# Patient Record
Sex: Female | Born: 1970 | Race: Black or African American | Hispanic: No | Marital: Single | Smoking: Never smoker
Health system: Southern US, Community
[De-identification: ages and names within clinical notes are randomized; demographics above are authoritative.]

## PROBLEM LIST (undated history)

## (undated) DIAGNOSIS — R569 Unspecified convulsions: Secondary | ICD-10-CM

## (undated) DIAGNOSIS — K219 Gastro-esophageal reflux disease without esophagitis: Secondary | ICD-10-CM

## (undated) HISTORY — PX: ABDOMINAL HYSTERECTOMY: SHX81

---

## 1998-02-20 ENCOUNTER — Emergency Department (HOSPITAL_COMMUNITY): Admission: EM | Admit: 1998-02-20 | Discharge: 1998-02-20 | Payer: Self-pay | Admitting: Emergency Medicine

## 1998-03-05 ENCOUNTER — Other Ambulatory Visit: Admission: RE | Admit: 1998-03-05 | Discharge: 1998-03-05 | Payer: Self-pay | Admitting: Obstetrics and Gynecology

## 1998-10-13 ENCOUNTER — Emergency Department (HOSPITAL_COMMUNITY): Admission: EM | Admit: 1998-10-13 | Discharge: 1998-10-13 | Payer: Self-pay | Admitting: Emergency Medicine

## 1998-10-17 ENCOUNTER — Encounter: Admission: RE | Admit: 1998-10-17 | Discharge: 1998-10-17 | Payer: Self-pay | Admitting: Internal Medicine

## 1999-01-14 ENCOUNTER — Encounter: Admission: RE | Admit: 1999-01-14 | Discharge: 1999-01-14 | Payer: Self-pay | Admitting: Hematology and Oncology

## 1999-01-18 ENCOUNTER — Encounter: Payer: Self-pay | Admitting: Hematology and Oncology

## 1999-01-18 ENCOUNTER — Ambulatory Visit (HOSPITAL_COMMUNITY): Admission: RE | Admit: 1999-01-18 | Discharge: 1999-01-18 | Payer: Self-pay | Admitting: Hematology and Oncology

## 1999-01-28 ENCOUNTER — Encounter: Admission: RE | Admit: 1999-01-28 | Discharge: 1999-01-28 | Payer: Self-pay | Admitting: Hematology and Oncology

## 1999-07-18 ENCOUNTER — Emergency Department (HOSPITAL_COMMUNITY): Admission: EM | Admit: 1999-07-18 | Discharge: 1999-07-18 | Payer: Self-pay | Admitting: *Deleted

## 2000-03-21 ENCOUNTER — Emergency Department (HOSPITAL_COMMUNITY): Admission: EM | Admit: 2000-03-21 | Discharge: 2000-03-21 | Payer: Self-pay | Admitting: *Deleted

## 2000-03-31 ENCOUNTER — Encounter: Admission: RE | Admit: 2000-03-31 | Discharge: 2000-03-31 | Payer: Self-pay | Admitting: Hematology and Oncology

## 2000-07-26 ENCOUNTER — Other Ambulatory Visit: Admission: RE | Admit: 2000-07-26 | Discharge: 2000-07-26 | Payer: Self-pay | Admitting: Family Medicine

## 2002-09-17 ENCOUNTER — Ambulatory Visit (HOSPITAL_COMMUNITY): Admission: RE | Admit: 2002-09-17 | Discharge: 2002-09-17 | Payer: Self-pay | Admitting: Internal Medicine

## 2002-10-04 ENCOUNTER — Emergency Department (HOSPITAL_COMMUNITY): Admission: EM | Admit: 2002-10-04 | Discharge: 2002-10-05 | Payer: Self-pay | Admitting: Emergency Medicine

## 2003-03-23 ENCOUNTER — Emergency Department (HOSPITAL_COMMUNITY): Admission: EM | Admit: 2003-03-23 | Discharge: 2003-03-23 | Payer: Self-pay

## 2003-04-05 ENCOUNTER — Ambulatory Visit: Admission: RE | Admit: 2003-04-05 | Discharge: 2003-04-05 | Payer: Self-pay

## 2003-08-17 ENCOUNTER — Emergency Department (HOSPITAL_COMMUNITY): Admission: EM | Admit: 2003-08-17 | Discharge: 2003-08-17 | Payer: Self-pay | Admitting: *Deleted

## 2003-09-23 ENCOUNTER — Emergency Department (HOSPITAL_COMMUNITY): Admission: EM | Admit: 2003-09-23 | Discharge: 2003-09-23 | Payer: Self-pay | Admitting: *Deleted

## 2003-09-25 ENCOUNTER — Ambulatory Visit: Payer: Self-pay | Admitting: *Deleted

## 2003-09-25 ENCOUNTER — Emergency Department (HOSPITAL_COMMUNITY): Admission: EM | Admit: 2003-09-25 | Discharge: 2003-09-25 | Payer: Self-pay | Admitting: Family Medicine

## 2004-01-31 ENCOUNTER — Ambulatory Visit: Payer: Self-pay | Admitting: Family Medicine

## 2004-02-18 ENCOUNTER — Ambulatory Visit: Payer: Self-pay | Admitting: Internal Medicine

## 2004-04-08 ENCOUNTER — Ambulatory Visit: Payer: Self-pay | Admitting: Family Medicine

## 2004-04-18 ENCOUNTER — Emergency Department (HOSPITAL_COMMUNITY): Admission: EM | Admit: 2004-04-18 | Discharge: 2004-04-18 | Payer: Self-pay | Admitting: Emergency Medicine

## 2004-04-22 ENCOUNTER — Ambulatory Visit: Payer: Self-pay | Admitting: Family Medicine

## 2004-07-01 ENCOUNTER — Ambulatory Visit: Payer: Self-pay | Admitting: Internal Medicine

## 2004-07-07 ENCOUNTER — Emergency Department (HOSPITAL_COMMUNITY): Admission: EM | Admit: 2004-07-07 | Discharge: 2004-07-07 | Payer: Self-pay | Admitting: Emergency Medicine

## 2004-08-15 ENCOUNTER — Emergency Department (HOSPITAL_COMMUNITY): Admission: EM | Admit: 2004-08-15 | Discharge: 2004-08-15 | Payer: Self-pay | Admitting: Emergency Medicine

## 2004-09-24 ENCOUNTER — Ambulatory Visit: Payer: Self-pay | Admitting: Family Medicine

## 2004-10-16 ENCOUNTER — Ambulatory Visit: Payer: Self-pay | Admitting: Family Medicine

## 2004-10-23 ENCOUNTER — Encounter (INDEPENDENT_AMBULATORY_CARE_PROVIDER_SITE_OTHER): Payer: Self-pay | Admitting: Family Medicine

## 2004-10-23 LAB — CONVERTED CEMR LAB: Pap Smear: NORMAL

## 2004-10-28 ENCOUNTER — Ambulatory Visit: Payer: Self-pay | Admitting: Family Medicine

## 2004-12-21 ENCOUNTER — Ambulatory Visit: Payer: Self-pay | Admitting: Family Medicine

## 2004-12-23 ENCOUNTER — Ambulatory Visit: Payer: Self-pay | Admitting: Family Medicine

## 2005-01-20 ENCOUNTER — Ambulatory Visit (HOSPITAL_COMMUNITY): Admission: RE | Admit: 2005-01-20 | Discharge: 2005-01-20 | Payer: Self-pay | Admitting: Family Medicine

## 2005-04-08 ENCOUNTER — Ambulatory Visit: Payer: Self-pay | Admitting: Family Medicine

## 2005-07-21 ENCOUNTER — Ambulatory Visit: Payer: Self-pay | Admitting: Family Medicine

## 2005-09-14 ENCOUNTER — Ambulatory Visit: Payer: Self-pay | Admitting: Family Medicine

## 2005-09-15 ENCOUNTER — Ambulatory Visit: Payer: Self-pay | Admitting: Family Medicine

## 2005-09-17 ENCOUNTER — Inpatient Hospital Stay (HOSPITAL_COMMUNITY): Admission: AD | Admit: 2005-09-17 | Discharge: 2005-09-17 | Payer: Self-pay | Admitting: Family Medicine

## 2005-10-09 ENCOUNTER — Emergency Department (HOSPITAL_COMMUNITY): Admission: EM | Admit: 2005-10-09 | Discharge: 2005-10-09 | Payer: Self-pay | Admitting: Family Medicine

## 2005-11-01 ENCOUNTER — Ambulatory Visit (HOSPITAL_COMMUNITY): Admission: RE | Admit: 2005-11-01 | Discharge: 2005-11-01 | Payer: Self-pay | Admitting: Obstetrics and Gynecology

## 2005-11-15 ENCOUNTER — Ambulatory Visit (HOSPITAL_COMMUNITY): Admission: RE | Admit: 2005-11-15 | Discharge: 2005-11-15 | Payer: Self-pay | Admitting: Obstetrics and Gynecology

## 2005-11-29 ENCOUNTER — Ambulatory Visit (HOSPITAL_COMMUNITY): Admission: RE | Admit: 2005-11-29 | Discharge: 2005-11-29 | Payer: Self-pay | Admitting: Obstetrics and Gynecology

## 2005-11-29 ENCOUNTER — Inpatient Hospital Stay (HOSPITAL_COMMUNITY): Admission: AD | Admit: 2005-11-29 | Discharge: 2005-11-29 | Payer: Self-pay | Admitting: Obstetrics & Gynecology

## 2005-12-16 ENCOUNTER — Ambulatory Visit (HOSPITAL_COMMUNITY): Admission: RE | Admit: 2005-12-16 | Discharge: 2005-12-16 | Payer: Self-pay | Admitting: Obstetrics and Gynecology

## 2005-12-30 ENCOUNTER — Inpatient Hospital Stay (HOSPITAL_COMMUNITY): Admission: AD | Admit: 2005-12-30 | Discharge: 2005-12-30 | Payer: Self-pay | Admitting: *Deleted

## 2006-01-20 ENCOUNTER — Ambulatory Visit (HOSPITAL_COMMUNITY): Admission: RE | Admit: 2006-01-20 | Discharge: 2006-01-20 | Payer: Self-pay | Admitting: Obstetrics and Gynecology

## 2006-02-24 ENCOUNTER — Inpatient Hospital Stay (HOSPITAL_COMMUNITY): Admission: AD | Admit: 2006-02-24 | Discharge: 2006-02-24 | Payer: Self-pay | Admitting: Obstetrics and Gynecology

## 2006-04-29 ENCOUNTER — Inpatient Hospital Stay (HOSPITAL_COMMUNITY): Admission: AD | Admit: 2006-04-29 | Discharge: 2006-05-02 | Payer: Self-pay | Admitting: Obstetrics and Gynecology

## 2006-07-05 ENCOUNTER — Encounter (INDEPENDENT_AMBULATORY_CARE_PROVIDER_SITE_OTHER): Payer: Self-pay | Admitting: Family Medicine

## 2006-07-05 DIAGNOSIS — R569 Unspecified convulsions: Secondary | ICD-10-CM | POA: Insufficient documentation

## 2006-07-05 DIAGNOSIS — F411 Generalized anxiety disorder: Secondary | ICD-10-CM | POA: Insufficient documentation

## 2006-07-05 DIAGNOSIS — J309 Allergic rhinitis, unspecified: Secondary | ICD-10-CM | POA: Insufficient documentation

## 2007-04-04 IMAGING — US US OB FOLLOW-UP
1 series · 14 of 28 positions shown · non-contrast
Comparison: none

OBSTETRICAL ULTRASOUND:
 This ultrasound was performed in The [HOSPITAL], and the [facility] report will be stored to [REDACTED] PACS.

[Series 1: us ob follow-up · 14 of 58 slices shown]
[im 3/58]
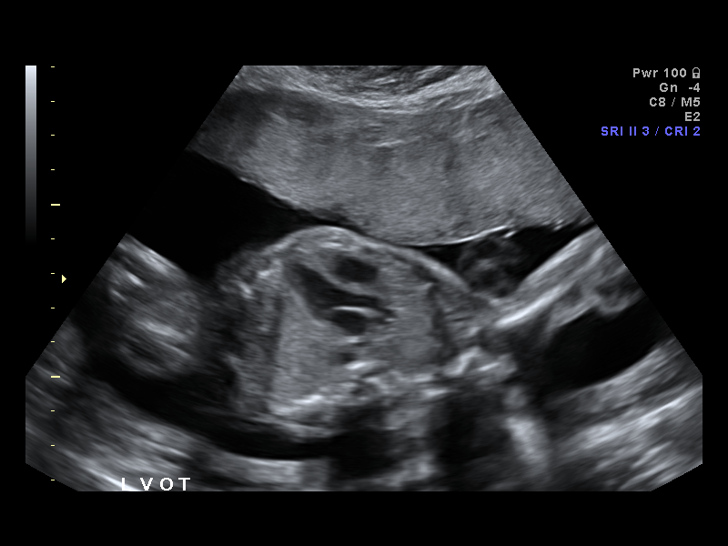
[im 7/58]
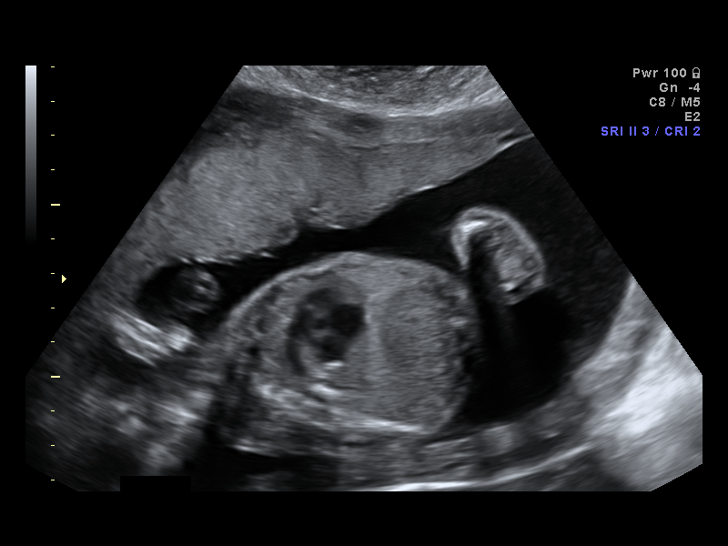
[im 11/58]
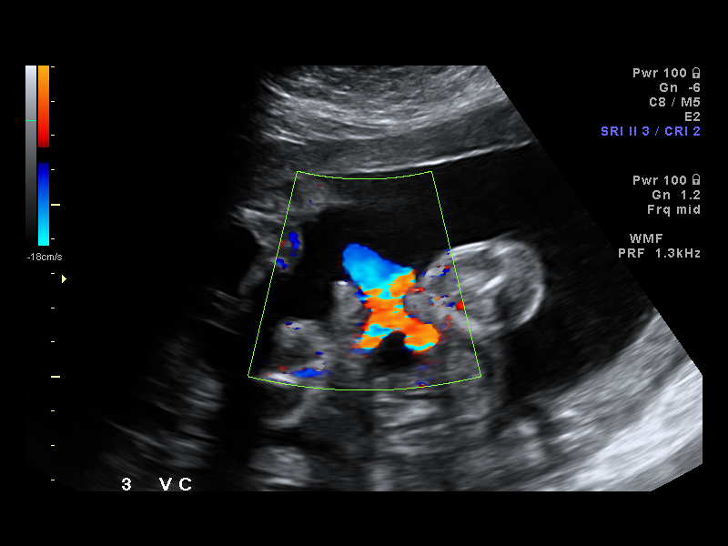
[im 15/58]
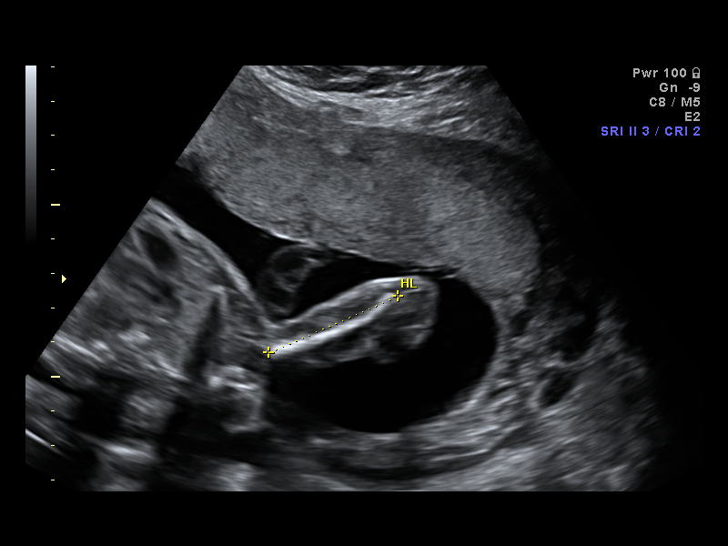
[im 20/58]
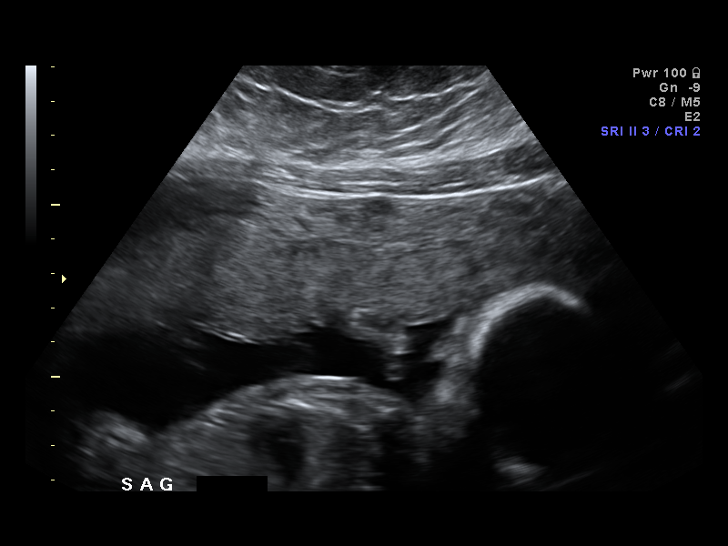
[im 24/58]
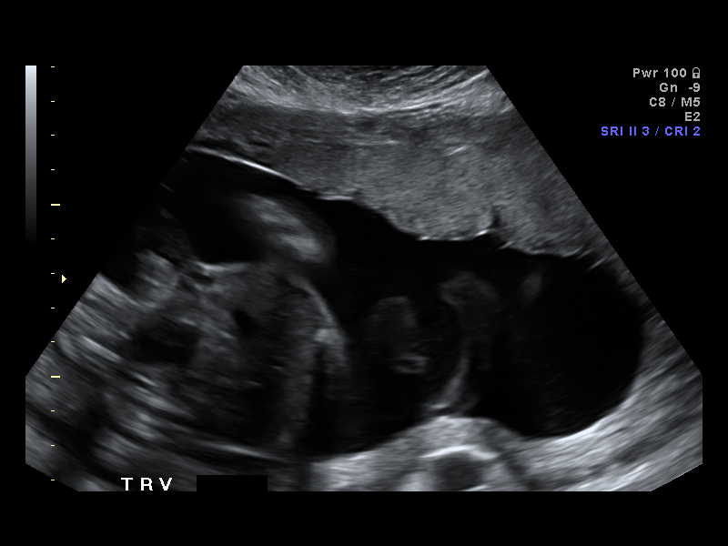
[im 28/58]
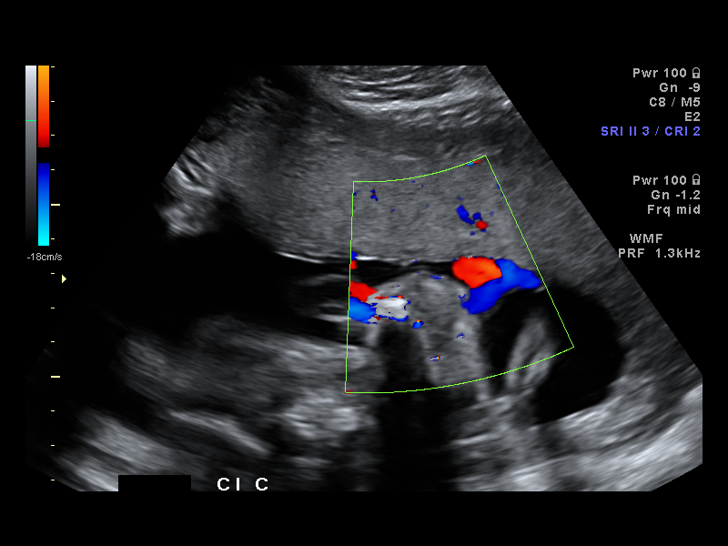
[im 32/58]
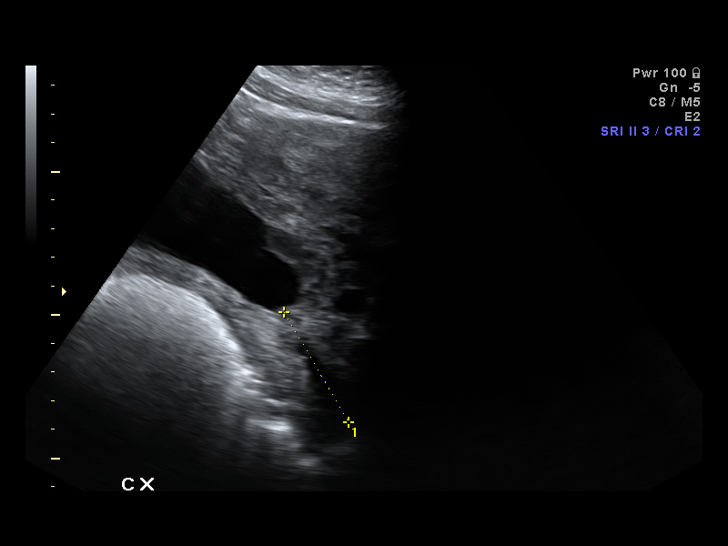
[im 36/58]
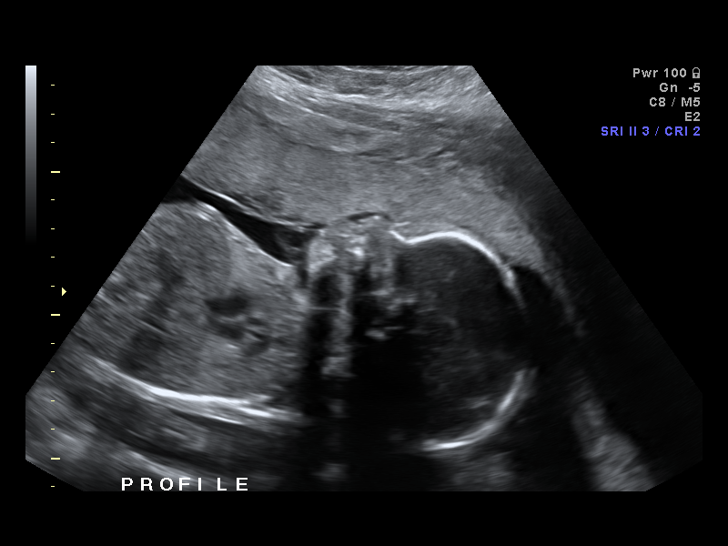
[im 41/58]
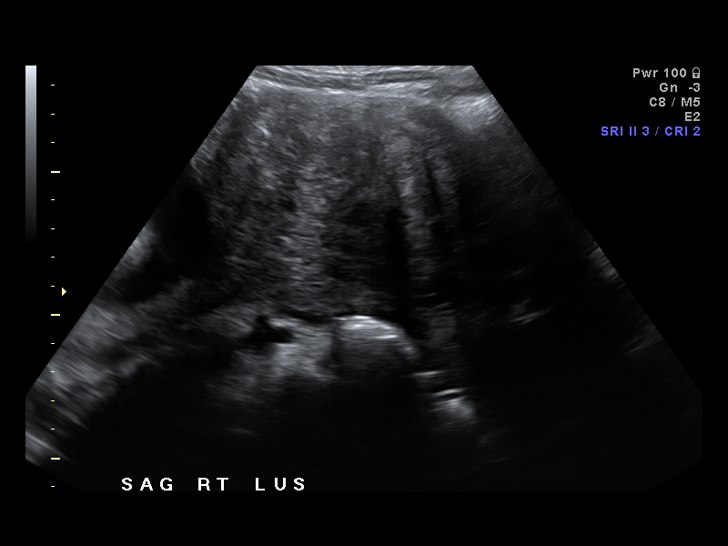
[im 45/58]
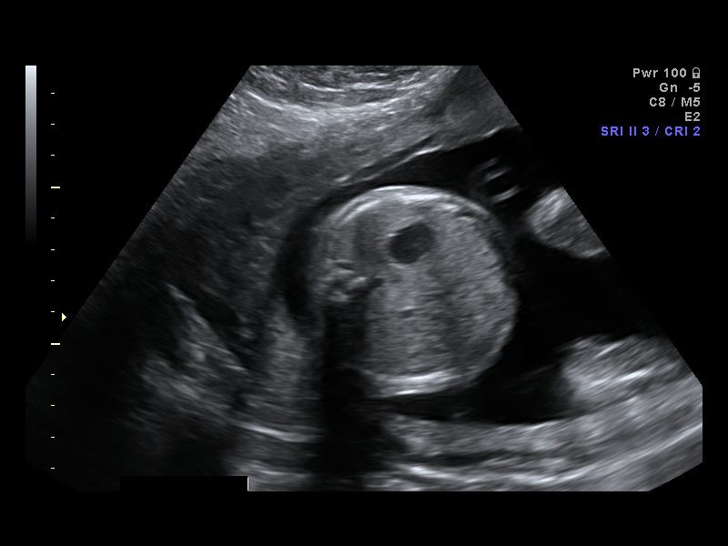
[im 49/58]
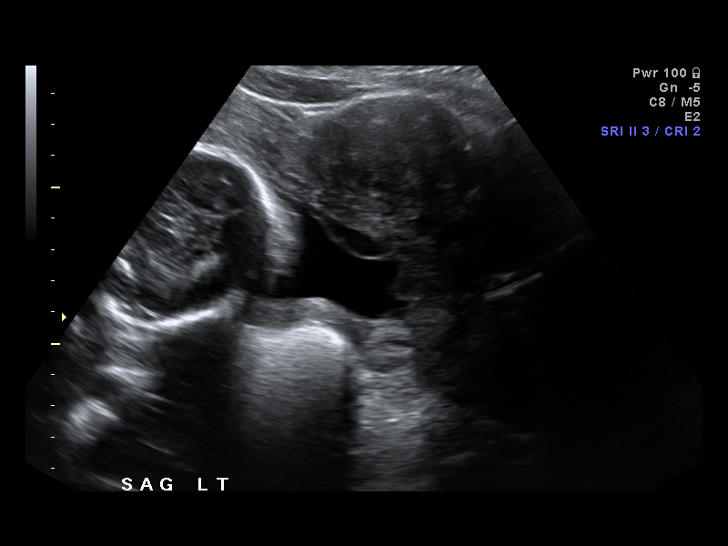
[im 53/58]
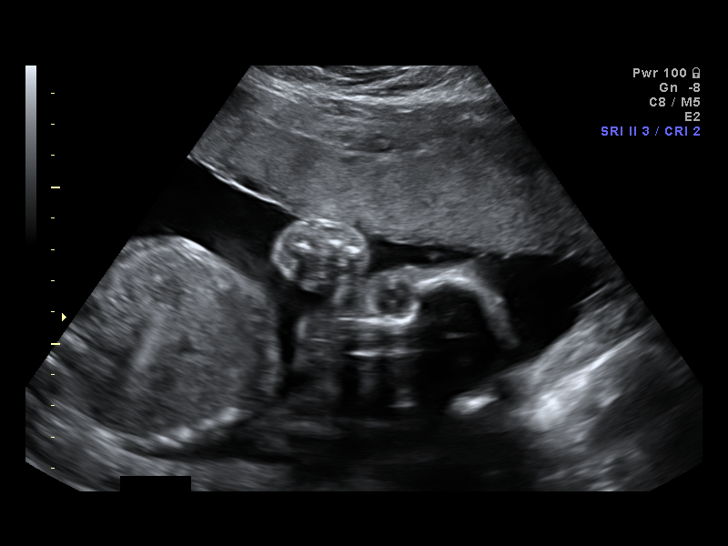
[im 58/58]
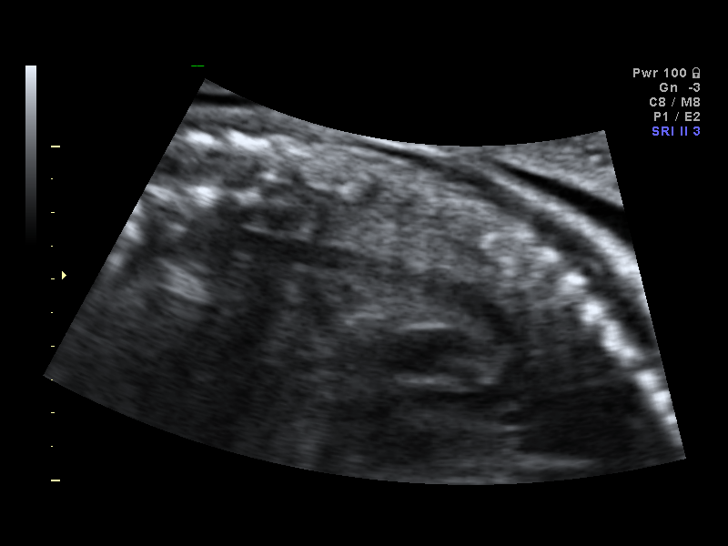

[14 of 28 positions shown; findings below may reference images not displayed]

IMPRESSION: The [facility] report has also been faxed to the ordering physician.

## 2007-06-05 ENCOUNTER — Emergency Department (HOSPITAL_COMMUNITY): Admission: EM | Admit: 2007-06-05 | Discharge: 2007-06-05 | Payer: Self-pay | Admitting: Family Medicine

## 2007-10-23 ENCOUNTER — Other Ambulatory Visit: Admission: RE | Admit: 2007-10-23 | Discharge: 2007-10-23 | Payer: Self-pay | Admitting: Family Medicine

## 2007-10-23 ENCOUNTER — Encounter (INDEPENDENT_AMBULATORY_CARE_PROVIDER_SITE_OTHER): Payer: Self-pay | Admitting: Adult Health

## 2007-10-23 ENCOUNTER — Ambulatory Visit: Payer: Self-pay | Admitting: Internal Medicine

## 2007-10-23 LAB — CONVERTED CEMR LAB
ALT: 17 units/L (ref 0–35)
Basophils Absolute: 0 10*3/uL (ref 0.0–0.1)
CO2: 21 meq/L (ref 19–32)
Chloride: 106 meq/L (ref 96–112)
Cholesterol: 176 mg/dL (ref 0–200)
Eosinophils Absolute: 0 10*3/uL (ref 0.0–0.7)
HCT: 39.9 % (ref 36.0–46.0)
Hemoglobin: 13.4 g/dL (ref 12.0–15.0)
MCHC: 33.6 g/dL (ref 30.0–36.0)
MCV: 83.6 fL (ref 78.0–100.0)
Monocytes Absolute: 0.3 10*3/uL (ref 0.1–1.0)
Platelets: 250 10*3/uL (ref 150–400)
RBC: 4.77 M/uL (ref 3.87–5.11)
RDW: 14.4 % (ref 11.5–15.5)
Sodium: 142 meq/L (ref 135–145)
Total Bilirubin: 0.4 mg/dL (ref 0.3–1.2)
Total Protein: 7.2 g/dL (ref 6.0–8.3)
VLDL: 13 mg/dL (ref 0–40)
WBC: 5.4 10*3/uL (ref 4.0–10.5)

## 2007-10-24 ENCOUNTER — Encounter (INDEPENDENT_AMBULATORY_CARE_PROVIDER_SITE_OTHER): Payer: Self-pay | Admitting: Adult Health

## 2007-11-01 ENCOUNTER — Ambulatory Visit (HOSPITAL_COMMUNITY): Admission: RE | Admit: 2007-11-01 | Discharge: 2007-11-01 | Payer: Self-pay | Admitting: Family Medicine

## 2008-02-29 ENCOUNTER — Ambulatory Visit: Payer: Self-pay | Admitting: Internal Medicine

## 2008-02-29 ENCOUNTER — Encounter (INDEPENDENT_AMBULATORY_CARE_PROVIDER_SITE_OTHER): Payer: Self-pay | Admitting: Adult Health

## 2008-03-07 ENCOUNTER — Ambulatory Visit (HOSPITAL_COMMUNITY): Admission: RE | Admit: 2008-03-07 | Discharge: 2008-03-07 | Payer: Self-pay | Admitting: Internal Medicine

## 2008-05-02 ENCOUNTER — Ambulatory Visit: Payer: Self-pay | Admitting: Obstetrics & Gynecology

## 2008-05-02 LAB — CONVERTED CEMR LAB
HCT: 38.8 % (ref 36.0–46.0)
Hemoglobin: 13.3 g/dL (ref 12.0–15.0)
MCHC: 34.3 g/dL (ref 30.0–36.0)
MCV: 83.3 fL (ref 78.0–100.0)
Platelets: 237 10*3/uL (ref 150–400)
RBC: 4.66 M/uL (ref 3.87–5.11)
RDW: 14.5 % (ref 11.5–15.5)
TSH: 0.827 microintl units/mL (ref 0.350–4.500)
WBC: 5.9 10*3/uL (ref 4.0–10.5)

## 2008-07-01 ENCOUNTER — Ambulatory Visit: Payer: Self-pay | Admitting: Obstetrics & Gynecology

## 2008-07-01 ENCOUNTER — Encounter: Payer: Self-pay | Admitting: Obstetrics & Gynecology

## 2008-07-01 ENCOUNTER — Ambulatory Visit (HOSPITAL_COMMUNITY): Admission: RE | Admit: 2008-07-01 | Discharge: 2008-07-02 | Payer: Self-pay | Admitting: Obstetrics & Gynecology

## 2008-08-09 ENCOUNTER — Ambulatory Visit: Payer: Self-pay | Admitting: Obstetrics & Gynecology

## 2009-05-28 ENCOUNTER — Emergency Department (HOSPITAL_COMMUNITY): Admission: EM | Admit: 2009-05-28 | Discharge: 2009-05-28 | Payer: Self-pay | Admitting: Family Medicine

## 2009-09-29 ENCOUNTER — Emergency Department (HOSPITAL_COMMUNITY): Admission: EM | Admit: 2009-09-29 | Discharge: 2009-09-29 | Payer: Self-pay | Admitting: Emergency Medicine

## 2010-01-25 ENCOUNTER — Encounter: Payer: Self-pay | Admitting: Obstetrics and Gynecology

## 2010-04-13 LAB — CBC
HCT: 35.7 % — ABNORMAL LOW (ref 36.0–46.0)
HCT: 38.4 % (ref 36.0–46.0)
Hemoglobin: 12.3 g/dL (ref 12.0–15.0)
Hemoglobin: 13.2 g/dL (ref 12.0–15.0)
MCHC: 34.3 g/dL (ref 30.0–36.0)
MCHC: 34.4 g/dL (ref 30.0–36.0)
MCV: 87.8 fL (ref 78.0–100.0)
MCV: 88.6 fL (ref 78.0–100.0)
RBC: 4.03 MIL/uL (ref 3.87–5.11)
RDW: 13.4 % (ref 11.5–15.5)
WBC: 6.1 10*3/uL (ref 4.0–10.5)
WBC: 7.9 10*3/uL (ref 4.0–10.5)

## 2010-05-19 NOTE — Discharge Summary (Signed)
Carol Prince, Carol Prince              ACCOUNT NO.:  [id number]   MEDICAL RECORD NO.:  [id number]          PATIENT TYPE:  OIB   LOCATION:  9304                          FACILITY:  [facility]   PHYSICIAN:  Allie Bossier, MD        DATE OF BIRTH:  1971/01/03   DATE OF ADMISSION:  07/01/2008  DATE OF DISCHARGE:  07/02/2008                               DISCHARGE SUMMARY   ADMISSION DIAGNOSES:  1. Fibroids.  2. Dysmenorrhea.   DISCHARGE DIAGNOSES:  1. Fibroids.  2. Dysmenorrhea.   PROCEDURE:  Total vaginal hysterectomy.   CONDITION:  Stable.   DISPOSITION:  Home.   DIET:  As tolerated.   FOLLOWUP:  Followup in 6 weeks or sooner as necessary.   DISCHARGE MEDICATIONS:  Percocet 1 p.o. q.4 h. p.r.n. pain #30, no  refills, but she is also instructed to take over-the-counter ibuprofen  600 mg q.6 h. as necessary.   BRIEF HISTORY OF PRESENT ILLNESS AND HOSPITAL COURSE:  Carol Prince is a  40 year old G0, who has been having long history of pelvic pain and  dysmenorrhea.  Ultrasound showed a 4-cm fibroid.  After discussion of  treatment options, she decided to have a hysterectomy.  She underwent an  uncomplicated vaginal hysterectomy under general anesthesia without  complications.  On the July 01, 2008, she had approximately 150 mL EBL.  Her preop hemoglobin was 13.2 and postop hemoglobin was 12.3.  By postop  day #1 she was ambulating, voiding without dysuria, and tolerating p.o.  She was also experiencing flatus and was ready to go home.  She will  follow up as above.      Allie Bossier, MD  Electronically Signed     MCD/MEDQ  D:  07/02/2008  T:  07/02/2008  Job:  [id number]

## 2010-05-19 NOTE — Op Note (Signed)
Carol Prince, Carol Prince              ACCOUNT NO.:  [id number]   MEDICAL RECORD NO.:  [id number]          PATIENT TYPE:  OIB   LOCATION:  9304                          [facility]   PHYSICIAN:  Allie Bossier, MD        DATE OF BIRTH:  May 13, 1970   DATE OF PROCEDURE:  07/01/2008  DATE OF DISCHARGE:                               OPERATIVE REPORT   PREOPERATIVE DIAGNOSES:  Fibroid uterus and dysmenorrhea.   POSTOPERATIVE DIAGNOSES:  Fibroid uterus and dysmenorrhea.   PROCEDURE:  Total vaginal hysterectomy.   SURGEON:  Allie Bossier, MD   ANESTHESIA:  GETA, Angelica Pou, MD   COMPLICATIONS:  None.   ESTIMATED BLOOD LOSS:  150 mL.   SPECIMEN:  Uterus.   FINDINGS:  Normal-appearing adnexa.  Enlarged fibroid uterus.   DETAIL OF PROCEDURE AND FINDINGS:  Risks, benefits, alternatives of  surgery were explained understood, and accepted.  Consents were signed.  All questions were answered prior to surgery.  In the operating room  general anesthesia was applied without complication.  She was placed in  dorsal lithotomy position.  Her vagina was prepped and draped usual  sterile fashion.  A Foley catheter was placed under sterile conditions  and it drained clear urine throughout the case.  Bimanual exam confirmed  that she has a 6-8 week size, very mobile uterus and nonenlarged adnexa.  She also has an excellent pelvis and suprapubic arch for a vaginal  hysterectomy.  A weighted speculum was placed posteriorly and Deaver  anteriorly.  The cervix was grasped across the anterior and posterior  lips the cervix with a single-tooth tenaculum.  I created a solution of  13 mL of 0.5% Marcaine, mixed with a 100 mL of injectable saline, 90 mL  of the solution was injected in a circumferential fashion in the  cervicovaginal junction for hydrodissection purposes.  A circumferential  incision was made a #10 blade at the same area.  The anterior and  posterior peritoneum were both entered, tagged  and held.  The  uterosacral ligaments were identified, clamped, cut and ligated.  A 2-0  Vicryl suture was used throughout this case unless otherwise specified.  The cervix was separated from its pelvic attachments using clamp, cut,  and ligate technique, always keeping the  Heaney clamps close to the  wall of the uterus.  When the uterus was removed, a sponge on a stick  was placed to hold the bowel out of the vagina and the pedicles were all  inspected.  All was hemostatic.  Please note that the uterosacral  ligaments had been tagged and held and the tagged uterosacral ligaments  were incorporated into the each angle of the vaginal cuff.  The  posterior peritoneum was closed in a pursestring fashion prior to this  and then the vaginal cuff was closed in a vertical fashion.  No defects  were palpable.  There was no bleeding.  A Foley catheter was draining  clear urine.  She was extubated and taken to recovery in stable  condition.      [name]  Marice Potter, MD  Electronically Signed     MCD/MEDQ  D:  07/01/2008  T:  07/02/2008  Job:  [id number]

## 2010-05-19 NOTE — Group Therapy Note (Signed)
NAME:  Carol Prince, VEGH NO.:  [id number]   MEDICAL RECORD NO.:  [id number]          PATIENT TYPE:  WOC   LOCATION:  [facility]                   FACILITY:  [facility]   PHYSICIAN:  Allie Bossier, MD        DATE OF BIRTH:  06-06-1970   DATE OF SERVICE:                                  CLINIC NOTE   HISTORY:  Donyetta is a 40 year old single black, gravida 4, para 2,  abortion 2 with 2 children.  She comes as a referral from the  appointment from the [facility].  She has a known history of fibroids  and she told the Cristhian Vanhook at the health department that she has  significant pain with her periods and would like to discuss a surgical  option for treatment.  When I discussed her pelvic pain with her, she  says that she does take ibuprofen 800 as necessary.  She says the  periods are not particularly heavy but can last up to 8 days.  She has  not had a thyroid or CBC checked that she notice of.  An ultrasound was  done last month which shows a 3.7 x 5.1 x 2.5 cm fibroids in the right  upper lateral uterine segment.  The endometrial lining was thin, with  width of 3.7 mm, the ovaries appeared normal.   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  She says that she has not  had a seizure anytime recently while she is being treated with  carbamazepine 200 mg twice a day.  I discussed with her the options of  either managing the pain/dysmenorrhea with medicines versus a  hysterectomy upon elucidation of the risks associated with surgery.  She  would like to consider her options at home and call me when she has  decided what she wants to do.  I am certainly in agreement with this  plan.  Bimanual exam does reveal that she has a mobile uterus at 6-8  weeks' size.  She has excellent pelvis.  She has an excellent suprapubic  arch for removal of a uterus vaginally.  I think she will be a good  candidate for  vaginal hysterectomy, should she choose surgical options.      Allie Bossier, MD     MCD/MEDQ  D:  05/02/2008  T:  05/03/2008  Job:  [id number]

## 2010-05-22 NOTE — H&P (Signed)
NAME:  Carol Prince, Carol Prince NO.:  [id number]   MEDICAL RECORD NO.:  [id number]          PATIENT TYPE:  INP   LOCATION:  9175                          [facility]ILITY[facility]   PHYSICIAN:  Osborn Coho, M.D.   DATE OF BIRTH:  12/21/1970   DATE OF ADMISSION:  04/29/2006  DATE OF DISCHARGE:                              HISTORY & PHYSICAL   HISTORY OF PRESENT ILLNESS:  This is a 40 year old gravida 4, para 1-0-2-  1 at 38-2/7 weeks who presents with complaints of gush of fluid at 5  p.m.; she does not feel any contractions and there is positive fetal  movement.  The pregnancy has been followed by the M.D. service and is  remarkable for:  1. AMA.  2. Increased BMI.  3. History of seizure disorder for which she takes Tegretol.  4. First trimester spotting.  5. Fibroids.  6. History of postpartum depression.  7. Group B strep negative.  8. Desires bilateral tubal ligation.   ALLERGIES:  AMPICILLIN.   OB HISTORY:  Remarkable for elective abortion in 1993 and 1996, and a  vaginal delivery in 1994 of a female infant at [redacted] weeks gestation weighing  7 pounds 13 ounces with no complications.   PAST MEDICAL HISTORY:  1. History of postpartum depression.  2. History of chlamydia 13 years ago.  3. History of seizures since childhood, due to stress, for which she      uses Tegretol.   PAST SURGICAL HISTORY:  1. Right foot surgery 20 years ago.  2. Cholecystectomy 10 years ago.   FAMILY HISTORY:  Remarkable for grandmother with heart disease,  hypertension, diabetes and renal failure.   GENETIC HISTORY:  Remarkable for 3 cousins with twins.   SOCIAL HISTORY:  The patient is single.  Father of the baby is [facility], involved and supportive.  She does not report a religious  affiliation.  She denies any alcohol, tobacco or drug use.   PRENATAL LABS:  Hemoglobin is 13.5 and platelets are 251.  Blood type is  A positive.  Antibody screen negative.  RPR nonreactive.   Rubella  immune.  Hepatitis negative.  HIV negative.  Gonorrhea negative.  Chlamydia negative.  Hemoglobin electrophoresis negative.  Cystic  fibrosis negative.   HISTORY OF CURRENT PREGNANCY:  Patient entered care at [redacted] weeks  gestation.  She was treated for nausea with Phenergan.  She was  counseled on prenatal genetic testing.  She had an ultrasound at 9 weeks  for bleeding and was found to have a complete previa with subchorionic  hemorrhage and also 2 fibroids on her uterus.  She had first trimester  screening done.  She was referred back to neurology but did not keep her  first several appointments.  She saw Dr. Margot Ables for consultation at 15  weeks; she declined amnio.  She had a normal first trimester screen  result.  Tegretol levels were checked throughout pregnancy and were  normal.  She had a Glucola at 26 weeks that was normal.   She had an ultrasound at 28 weeks showing 89 percentile growth and  otherwise normal.  She had another ultrasound at 32 weeks showing 66  percentile growth, fibroids were stable at 4 and 8 cm.   Tegretol was increased to three times a day with therapeutic Tegretol  levels.  Group B strep was negative at term.   OBJECTIVE:  VITAL SIGNS:  Stable, afebrile.  HEENT:  Within normal limits.  NECK:  Thyroid normal, not enlarged.  CHEST:  Clear to auscultation.  HEART:  Regular rate and rhythm.  ABDOMEN:  Gravid.  PELVIC:  Vertex to Leopold's.  EFM shows reactive fetal heart rate with  contractions every 6-10 minutes which are mild.  Cervix is 3 cm, 70%  effaced and -3 station with a vertex presentation.  There is clear fluid  draining copiously from the vagina.  EXTREMITIES:  Within normal limits except for trace edema.   ASSESSMENT:  1. Intrauterine pregnancy at 38-2/7 weeks.  2. Premature rupture of membranes at term.  3. Prodromal contractions.   PLAN:  1. Admit per Dr. Su Hilt.  2. Routine M.D. orders.  3. Pitocin per low-dose protocol.   4. Epidural or Stadol p.r.n.      Marie L. Williams, C.N.M.      Osborn Coho, M.D.  Electronically Signed    MLW/MEDQ  D:  04/29/2006  T:  04/29/2006  Job:  [id number]

## 2010-05-28 ENCOUNTER — Ambulatory Visit (INDEPENDENT_AMBULATORY_CARE_PROVIDER_SITE_OTHER): Payer: Self-pay

## 2010-05-28 ENCOUNTER — Inpatient Hospital Stay (INDEPENDENT_AMBULATORY_CARE_PROVIDER_SITE_OTHER)
Admission: RE | Admit: 2010-05-28 | Discharge: 2010-05-28 | Disposition: A | Discharge status: Home / Self Care | Payer: Self-pay | Source: Ambulatory Visit | Attending: Emergency Medicine | Admitting: Emergency Medicine

## 2010-05-28 DIAGNOSIS — S139XXA Sprain of joints and ligaments of unspecified parts of neck, initial encounter: Secondary | ICD-10-CM

## 2010-11-14 ENCOUNTER — Emergency Department (INDEPENDENT_AMBULATORY_CARE_PROVIDER_SITE_OTHER)
Admission: EM | Admit: 2010-11-14 | Discharge: 2010-11-14 | Disposition: A | Discharge status: Home / Self Care | Payer: Self-pay | Source: Home / Self Care | Attending: Emergency Medicine | Admitting: Emergency Medicine

## 2010-11-14 DIAGNOSIS — J029 Acute pharyngitis, unspecified: Secondary | ICD-10-CM

## 2010-11-14 DIAGNOSIS — J069 Acute upper respiratory infection, unspecified: Secondary | ICD-10-CM

## 2010-11-14 HISTORY — DX: Unspecified convulsions: R56.9

## 2010-11-14 MED ORDER — GUAIFENESIN-CODEINE 100-10 MG/5ML PO SYRP: 5.0000 mL | ORAL_SOLUTION | Freq: Three times a day (TID) | ORAL | Status: AC | PRN

## 2010-11-14 MED ORDER — LORATADINE 10 MG PO TABS: 10.0000 mg | ORAL_TABLET | Freq: Every day | ORAL | Status: DC

## 2010-11-14 NOTE — ED Provider Notes (Addendum)
History     CSN: [id number] Arrival date & time: 11/14/2010  5:17 PM   First MD Initiated Contact with Patient 11/14/10 1752      Chief Complaint  Patient presents with  . Sore Throat    Pt has sorethroat, chills, body aches and chest congestion that started yesterday    (HPI Comments: My chest burns and my throat" now Im starting to feel my nose its runny,also with a headache..  Patient is a 40 y.o. female presenting with pharyngitis. The history is provided by the patient. A language interpreter was used.  Sore Throat This is a new problem. The current episode started yesterday. The problem occurs constantly. The problem has not changed since onset.Pertinent negatives include no chest pain, no abdominal pain, no headaches and no shortness of breath. The symptoms are aggravated by swallowing. The symptoms are relieved by nothing.    Past Medical History  Diagnosis Date  . Seizures     Past Surgical History  Procedure Date  . Abdominal hysterectomy     History reviewed. No pertinent family history.  History  Substance Use Topics  . Smoking status: Never Smoker   . Smokeless tobacco: Not on file  . Alcohol Use: No    OB History    Grav Para Term Preterm Abortions TAB SAB Ect Mult Living                  Review of Systems  Respiratory: Negative for shortness of breath.   Cardiovascular: Negative for chest pain.  Gastrointestinal: Negative for abdominal pain.  Neurological: Negative for headaches.    Allergies  Ampicillin  Home Medications   Current Outpatient Rx  Name Route Sig Dispense Refill  . CARBAMAZEPINE 200 MG PO TABS Oral Take 200 mg by mouth 2 (two) times daily between meals.        BP 112/78  Pulse 89  Temp(Src) 99.4 F (37.4 C) (Oral)  Resp 14  SpO2 99%  Physical Exam  ED Course  Procedures (including critical care time)   Labs Reviewed  POCT RAPID STREP A ([facility] ONLY)   No results found.   No diagnosis found.    MDM   EARLY URI, works at daycare        Jimmie Molly, MD 11/14/10 1803  Jimmie Molly, MD 11/14/10 [phone]

## 2011-02-13 ENCOUNTER — Emergency Department (HOSPITAL_COMMUNITY)
Admission: EM | Admit: 2011-02-13 | Discharge: 2011-02-13 | Disposition: A | Discharge status: Home / Self Care | Payer: Self-pay | Attending: Emergency Medicine | Admitting: Emergency Medicine

## 2011-02-13 ENCOUNTER — Encounter (HOSPITAL_COMMUNITY): Payer: Self-pay

## 2011-02-13 ENCOUNTER — Other Ambulatory Visit: Payer: Self-pay

## 2011-02-13 DIAGNOSIS — Z79899 Other long term (current) drug therapy: Secondary | ICD-10-CM | POA: Insufficient documentation

## 2011-02-13 DIAGNOSIS — G40909 Epilepsy, unspecified, not intractable, without status epilepticus: Secondary | ICD-10-CM | POA: Insufficient documentation

## 2011-02-13 LAB — DIFFERENTIAL
Basophils Absolute: 0 10*3/uL (ref 0.0–0.1)
Basophils Relative: 0 % (ref 0–1)
Lymphocytes Relative: 30 % (ref 12–46)
Monocytes Relative: 8 % (ref 3–12)
Neutro Abs: 3.2 10*3/uL (ref 1.7–7.7)
Neutrophils Relative %: 61 % (ref 43–77)

## 2011-02-13 LAB — BASIC METABOLIC PANEL
Chloride: 107 mEq/L (ref 96–112)
GFR calc Af Amer: >90 mL/min (ref >90)
Potassium: 3.8 mEq/L (ref 3.5–5.1)

## 2011-02-13 LAB — CBC
Hemoglobin: 12.4 g/dL (ref 12.0–15.0)
MCHC: 35 g/dL (ref 30.0–36.0)
RDW: 13.7 % (ref 11.5–15.5)
WBC: 5.3 10*3/uL (ref 4.0–10.5)

## 2011-02-13 LAB — CARBAMAZEPINE LEVEL, TOTAL: Carbamazepine Lvl: 3.7 ug/mL — ABNORMAL LOW (ref 4.0–12.0)

## 2011-02-13 MED ORDER — CARBAMAZEPINE 200 MG PO TABS
200.0000 mg | ORAL_TABLET | Freq: Once | ORAL | Status: AC
  Administered 2011-02-13: 200 mg via ORAL
  Filled 2011-02-13: qty 1

## 2011-02-13 MED ORDER — LORAZEPAM 1 MG PO TABS
1.0000 mg | ORAL_TABLET | Freq: Once | ORAL | Status: AC
  Administered 2011-02-13: 1 mg via ORAL
  Filled 2011-02-13: qty 1

## 2011-02-13 NOTE — ED Notes (Signed)
IHK:VQ25<ZD> Expected date:02/13/11<BR> Expected time:10:22 AM<BR> Means of arrival:Ambulance<BR> Comments:<BR> EMS, seizure 1 minute, History of seizure

## 2011-02-13 NOTE — ED Provider Notes (Signed)
History     CSN: [id number]  Arrival date & time 02/13/11  1033   First MD Initiated Contact with Patient 02/13/11 1041      Chief Complaint  Patient presents with  . Seizures    (Consider location/radiation/quality/duration/timing/severity/associated sxs/prior treatment) HPI History provided by pt.   Pt has h/o seizure disorder for which she is compliant w/ tegretol.  Most recent seizure was 2 years ago and she typically has them once a year.  Had a seizure in class this morning that was reported by a witness to have lasted less than 1 minute.  She was post-ictal when EMS arrived.  Pt is not sure of what kind of seizure activity she had but believes she spaced out.  Did not bite tongue nor was she incontinent of urine.  No recent head trauma.  No recent illnesses.  Has had a partial hysterectomy and pregnancy therefore not possible.  Pt has been very stressed out and believes she brought the seizure on herself.   Past Medical History  Diagnosis Date  . Seizures     Past Surgical History  Procedure Date  . Abdominal hysterectomy     No family history on file.  History  Substance Use Topics  . Smoking status: Never Smoker   . Smokeless tobacco: Not on file  . Alcohol Use: No    OB History    Grav Para Term Preterm Abortions TAB SAB Ect Mult Living                  Review of Systems  All other systems reviewed and are negative.    Allergies  Ampicillin  Home Medications   Current Outpatient Rx  Name Route Sig Dispense Refill  . CARBAMAZEPINE 200 MG PO TABS Oral Take 200 mg by mouth 2 (two) times daily between meals.      Marland Kitchen LORATADINE 10 MG PO TABS Oral Take 1 tablet (10 mg total) by mouth daily. 10 tablet 0    BP 123/79  Pulse 87  Temp(Src) 98.7 F (37.1 C) (Oral)  Resp 20  SpO2 98%  Physical Exam  Nursing note and vitals reviewed. Constitutional: She is oriented to person, place, and time. She appears well-developed and well-nourished. No distress.       tearful  HENT:  Head: Normocephalic and atraumatic.  Eyes:       Normal appearance  Neck: Normal range of motion.  Cardiovascular: Normal rate and regular rhythm.   Pulmonary/Chest: Effort normal and breath sounds normal.  Musculoskeletal: Normal range of motion.  Neurological: She is alert and oriented to person, place, and time. She has normal reflexes. No cranial nerve deficit or sensory deficit. Coordination normal.       5/5 and equal upper and lower extremity strength.  No past pointing.   Skin: Skin is warm and dry. No rash noted.  Psychiatric: She has a normal mood and affect. Her behavior is normal.    ED Course  Procedures (including critical care time)  Labs Reviewed  CBC - Abnormal; Notable for the following:    HCT 35.4 (*)    All other components within normal limits  BASIC METABOLIC PANEL - Abnormal; Notable for the following:    Glucose, Bld 111 (*)    All other components within normal limits  CARBAMAZEPINE LEVEL, TOTAL - Abnormal; Notable for the following:    Carbamazepine Lvl 3.7 (*)    All other components within normal limits  DIFFERENTIAL   No  results found.   1. Seizure disorder       MDM  41yo F w/ h/o seizure disorder for which she is compliant w/ her tegretol, presents w/ c/o seizure that she attributes to stress.  No recent illness or head trauma.  No focal neuro deficits on exam.  Basic labs and tegretol level pending. Pt has received 1mg  ativan.   Labs unremarkable w/ exception of slightly low Tegretol level. Pt received 1po in ED and instructed to continue at home as prescribed.  Referred to [facility] (used to go to [place] but would like to avoid commute) and advised not to drive for the next 6 months or until cleared by the neurologist.           Otilio Miu, PA 02/13/11 [phone]

## 2011-02-13 NOTE — ED Notes (Signed)
Pt has a hx of seizures but has not had one for 2 years.  She was sitting in a class this morning and had a witnessed seizure lasting less than 1 minute.  EMS states that she was slightly post ictal when they arrived.  Pt states that she was very stressed with the class today and did not know if that had something to do with it.  She takes tegretol for the seizures and has not missed any doses.  She is presently alert and oriented x3.

## 2011-02-14 NOTE — ED Provider Notes (Signed)
Medical screening examination/treatment/procedure(s) were performed by non-physician practitioner and as supervising physician I was immediately available for consultation/collaboration.  Darlina Mccaughey R. Sayan Aldava, MD 02/14/11 0806 

## 2012-02-27 ENCOUNTER — Emergency Department (HOSPITAL_COMMUNITY)
Admission: EM | Admit: 2012-02-27 | Discharge: 2012-02-27 | Disposition: A | Discharge status: Home Health | Payer: Self-pay | Attending: Emergency Medicine | Admitting: Emergency Medicine

## 2012-02-27 ENCOUNTER — Encounter (HOSPITAL_COMMUNITY): Payer: Self-pay | Admitting: *Deleted

## 2012-02-27 ENCOUNTER — Encounter (HOSPITAL_COMMUNITY): Payer: Self-pay | Admitting: Emergency Medicine

## 2012-02-27 ENCOUNTER — Emergency Department (HOSPITAL_COMMUNITY)
Admission: EM | Admit: 2012-02-27 | Discharge: 2012-02-27 | Disposition: A | Discharge status: Home / Self Care | Payer: Self-pay | Attending: Emergency Medicine | Admitting: Emergency Medicine

## 2012-02-27 DIAGNOSIS — Z79899 Other long term (current) drug therapy: Secondary | ICD-10-CM | POA: Insufficient documentation

## 2012-02-27 DIAGNOSIS — G40909 Epilepsy, unspecified, not intractable, without status epilepticus: Secondary | ICD-10-CM | POA: Insufficient documentation

## 2012-02-27 DIAGNOSIS — R209 Unspecified disturbances of skin sensation: Secondary | ICD-10-CM | POA: Insufficient documentation

## 2012-02-27 DIAGNOSIS — R7989 Other specified abnormal findings of blood chemistry: Secondary | ICD-10-CM | POA: Insufficient documentation

## 2012-02-27 DIAGNOSIS — R569 Unspecified convulsions: Secondary | ICD-10-CM

## 2012-02-27 LAB — CBC WITH DIFFERENTIAL/PLATELET
Lymphocytes Relative: 19 % (ref 12–46)
Lymphs Abs: 1.8 10*3/uL (ref 0.7–4.0)
Neutro Abs: 7 10*3/uL (ref 1.7–7.7)
Neutrophils Relative %: 73 % (ref 43–77)
Platelets: 211 10*3/uL (ref 150–400)
RBC: 4.62 MIL/uL (ref 3.87–5.11)
WBC: 9.6 10*3/uL (ref 4.0–10.5)

## 2012-02-27 LAB — BASIC METABOLIC PANEL
CO2: 23 mEq/L (ref 19–32)
Chloride: 101 mEq/L (ref 96–112)
GFR calc non Af Amer: >90 mL/min (ref >90)
Glucose, Bld: 111 mg/dL — ABNORMAL HIGH (ref 70–99)
Potassium: 3.6 mEq/L (ref 3.5–5.1)
Sodium: 134 mEq/L — ABNORMAL LOW (ref 135–145)

## 2012-02-27 MED ORDER — LORAZEPAM 2 MG/ML IJ SOLN: 1.0000 mg | INTRAMUSCULAR | Status: DC | PRN

## 2012-02-27 MED ORDER — CARBAMAZEPINE 200 MG PO TABS
200.0000 mg | ORAL_TABLET | Freq: Once | ORAL | Status: AC
  Administered 2012-02-27: 200 mg via ORAL
  Filled 2012-02-27: qty 1

## 2012-02-27 MED ORDER — LORAZEPAM 1 MG PO TABS
1.0000 mg | ORAL_TABLET | Freq: Once | ORAL | Status: AC
  Administered 2012-02-27: 1 mg via ORAL
  Filled 2012-02-27: qty 1

## 2012-02-27 MED ORDER — LORAZEPAM 2 MG/ML IJ SOLN: 2.0000 mg | INTRAMUSCULAR | Status: DC | PRN

## 2012-02-27 MED ORDER — CARBAMAZEPINE 100 MG/5ML PO SUSP
536.0000 mg | Freq: Once | ORAL | Status: AC
  Administered 2012-02-27: 536 mg via ORAL
  Filled 2012-02-27: qty 30

## 2012-02-27 NOTE — ED Notes (Signed)
MD at bedside. 

## 2012-02-27 NOTE — ED Notes (Signed)
Pt family leaving. Mother Left number to contact - [phone]. Mothers name is Claris Che.

## 2012-02-27 NOTE — ED Notes (Signed)
X 2 seizures.Carol Prince - tingling in rt. Hand; second - didn't remember. 2 minutes duration. No oral injury. Some incontinence.

## 2012-02-27 NOTE — ED Notes (Signed)
Pt amb to BR without problem.  Pt states she is feeling better

## 2012-02-27 NOTE — ED Notes (Signed)
Awaiting medication verification from pharmacy

## 2012-02-27 NOTE — ED Provider Notes (Signed)
History     CSN: [id number]  Arrival date & time 02/27/12  0019   First MD Initiated Contact with Patient 02/27/12 [phone]      Chief Complaint  Patient presents with  . Seizures    (Consider location/radiation/quality/duration/timing/severity/associated sxs/prior treatment) HPI 42 year old female presents to the restaurant with report of 2 seizures this evening.  Patient with history of epilepsy, is on Tegretol.  She denies this any doses of her medication.  She's been sleeping well, no recent illnesses, no stress to trigger her seizure.  Last seizure was about one year ago.  Patient reports initial seizure was tingling in her hand and unable to respond to those around her, second seizure was tonic-clonic.  Family brought her in the emergency department as she did not come out of her second seizure quickly.  She is now back to her baseline.  No tongue biting, no incontinence. Past Medical History  Diagnosis Date  . Seizures     Past Surgical History  Procedure Laterality Date  . Abdominal hysterectomy      No family history on file.  History  Substance Use Topics  . Smoking status: Never Smoker   . Smokeless tobacco: Not on file  . Alcohol Use: No    OB History   Grav Para Term Preterm Abortions TAB SAB Ect Mult Living                  Review of Systems  Allergies  Ampicillin  Home Medications   Current Outpatient Rx  Name  Route  Sig  Dispense  Refill  . carbamazepine (TEGRETOL) 200 MG tablet   Oral   Take 200 mg by mouth 2 (two) times daily between meals.          Marland Kitchen EXPIRED: loratadine (CLARITIN) 10 MG tablet   Oral   Take 1 tablet (10 mg total) by mouth daily.   10 tablet   0     BP 109/64  Pulse 86  Temp(Src) 98.3 F (36.8 C) (Oral)  Resp 20  SpO2 100%  Physical Exam  Nursing note and vitals reviewed. Constitutional: She is oriented to person, place, and time. She appears well-developed and well-nourished.  HENT:  Head: Normocephalic and  atraumatic.  Right Ear: External ear normal.  Left Ear: External ear normal.  Nose: Nose normal.  Mouth/Throat: Oropharynx is clear and moist.  Eyes: Conjunctivae and EOM are normal. Pupils are equal, round, and reactive to light.  Neck: Normal range of motion. Neck supple. No JVD present. No tracheal deviation present. No thyromegaly present.  Cardiovascular: Normal rate, regular rhythm, normal heart sounds and intact distal pulses.  Exam reveals no gallop and no friction rub.   No murmur heard. Pulmonary/Chest: Effort normal and breath sounds normal. No stridor. No respiratory distress. She has no wheezes. She has no rales. She exhibits no tenderness.  Abdominal: Soft. Bowel sounds are normal. She exhibits no distension and no mass. There is no tenderness. There is no rebound and no guarding.  Musculoskeletal: Normal range of motion. She exhibits no edema and no tenderness.  Lymphadenopathy:    She has no cervical adenopathy.  Neurological: She is oriented to person, place, and time. She has normal reflexes. No cranial nerve deficit. She exhibits normal muscle tone. Coordination normal.  Skin: Skin is warm and dry. No rash noted. No erythema. No pallor.  Psychiatric: She has a normal mood and affect. Her behavior is normal. Judgment and thought content normal.  ED Course  Procedures (including critical care time)  Labs Reviewed  CARBAMAZEPINE LEVEL, TOTAL - Abnormal; Notable for the following:    Carbamazepine Lvl 2.6 (*)    All other components within normal limits  CBC WITH DIFFERENTIAL - Abnormal; Notable for the following:    MCHC 36.6 (*)    All other components within normal limits  BASIC METABOLIC PANEL - Abnormal; Notable for the following:    Sodium 134 (*)    Glucose, Bld 111 (*)    All other components within normal limits   No results found.   1. Seizure secondary to subtherapeutic anticonvulsant medication       MDM  39-year-old female with seizure.  Noted  to have subtherapeutic Tegretol dose.  We'll give dose of oral Ativan and extra dose of Tegretol.  Patient advised to followup with her neurologist.        Olivia Mackie, MD 02/27/12 [phone]

## 2012-02-27 NOTE — ED Provider Notes (Signed)
History     CSN: [id number]  Arrival date & time 02/27/12  4098   First MD Initiated Contact with Patient 02/27/12 [phone]      Chief Complaint  Patient presents with  . Seizures    (Consider location/radiation/quality/duration/timing/severity/associated sxs/prior treatment) HPI 14 42-year-old female presents emergency department from home with complaint of seizure.  Patient was seen by me earlier in the evening for breakthrough seizure, it was noted to have subtherapeutic care Tegretol levels.  She was given an extra dose of her Tegretol and Ativan.  Upon arriving home, patient had another seizure.  She is back to baseline is without any complaints at this time.  Past Medical History  Diagnosis Date  . Seizures     Past Surgical History  Procedure Laterality Date  . Abdominal hysterectomy      No family history on file.  History  Substance Use Topics  . Smoking status: Never Smoker   . Smokeless tobacco: Not on file  . Alcohol Use: No    OB History   Grav Para Term Preterm Abortions TAB SAB Ect Mult Living                  Review of Systems  All other systems reviewed and are negative.    Allergies  Ampicillin  Home Medications   Current Outpatient Rx  Name  Route  Sig  Dispense  Refill  . carbamazepine (TEGRETOL) 200 MG tablet   Oral   Take 200 mg by mouth 2 (two) times daily between meals.          Marland Kitchen EXPIRED: loratadine (CLARITIN) 10 MG tablet   Oral   Take 1 tablet (10 mg total) by mouth daily.   10 tablet   0     BP 115/75  Pulse 116  Temp(Src) 99.4 F (37.4 C) (Oral)  Resp 14  SpO2 95%  Physical Exam  Nursing note and vitals reviewed. Constitutional: She is oriented to person, place, and time. She appears well-developed and well-nourished.  HENT:  Head: Normocephalic and atraumatic.  Right Ear: External ear normal.  Left Ear: External ear normal.  Nose: Nose normal.  Mouth/Throat: Oropharynx is clear and moist.  Eyes: Conjunctivae  and EOM are normal. Pupils are equal, round, and reactive to light.  Neck: Normal range of motion. Neck supple. No JVD present. No tracheal deviation present. No thyromegaly present.  Cardiovascular: Normal rate, regular rhythm, normal heart sounds and intact distal pulses.  Exam reveals no gallop and no friction rub.   No murmur heard. Pulmonary/Chest: Effort normal and breath sounds normal. No stridor. No respiratory distress. She has no wheezes. She has no rales. She exhibits no tenderness.  Abdominal: Soft. Bowel sounds are normal. She exhibits no distension and no mass. There is no tenderness. There is no rebound and no guarding.  Musculoskeletal: Normal range of motion. She exhibits no edema and no tenderness.  Lymphadenopathy:    She has no cervical adenopathy.  Neurological: She is alert and oriented to person, place, and time. She has normal reflexes. No cranial nerve deficit. She exhibits normal muscle tone. Coordination normal.  Skin: Skin is warm and dry. No rash noted. No erythema. No pallor.  Psychiatric: She has a normal mood and affect. Her behavior is normal. Judgment and thought content normal.    ED Course  Procedures (including critical care time)  Labs Reviewed  CARBAMAZEPINE LEVEL, TOTAL   No results found.   1. Seizure secondary to subtherapeutic  anticonvulsant medication       MDM  42 year old female with history of seizure disorder with known low Tegretol level.  She has had an additional seizure.  Will give an oral loading dose of Tegretol as a suspension to assist with absorption.  We'll recheck a Tegretol with a level 3 hours after completion of the medication.        Olivia Mackie, MD 02/27/12 [phone]

## 2012-02-27 NOTE — ED Notes (Signed)
Pt was discharged approx 30 minutes ago. Pt [facility] and denies [facility] medication doses. Pt arrived at home and had another seizure.

## 2012-02-27 NOTE — ED Notes (Signed)
Medication requested from pharmacy.

## 2012-02-27 NOTE — ED Notes (Signed)
Pt states she had 2 back to back seizures lasting about 2 minutes

## 2012-07-05 ENCOUNTER — Emergency Department (INDEPENDENT_AMBULATORY_CARE_PROVIDER_SITE_OTHER): Payer: Self-pay

## 2012-07-05 ENCOUNTER — Emergency Department (HOSPITAL_COMMUNITY): Payer: Self-pay

## 2012-07-05 ENCOUNTER — Encounter (HOSPITAL_COMMUNITY): Payer: Self-pay | Admitting: *Deleted

## 2012-07-05 ENCOUNTER — Emergency Department (INDEPENDENT_AMBULATORY_CARE_PROVIDER_SITE_OTHER)
Admission: EM | Admit: 2012-07-05 | Discharge: 2012-07-05 | Disposition: A | Discharge status: Home / Self Care | Payer: Self-pay | Source: Home / Self Care

## 2012-07-05 DIAGNOSIS — M7541 Impingement syndrome of right shoulder: Secondary | ICD-10-CM

## 2012-07-05 DIAGNOSIS — M76899 Other specified enthesopathies of unspecified lower limb, excluding foot: Secondary | ICD-10-CM

## 2012-07-05 DIAGNOSIS — M25819 Other specified joint disorders, unspecified shoulder: Secondary | ICD-10-CM

## 2012-07-05 DIAGNOSIS — M7061 Trochanteric bursitis, right hip: Secondary | ICD-10-CM

## 2012-07-05 MED ORDER — METHYLPREDNISOLONE 4 MG PO KIT: PACK | ORAL | Status: DC

## 2012-07-05 NOTE — ED Provider Notes (Signed)
Medical screening examination/treatment/procedure(s) were performed by non-physician practitioner and as supervising physician I was immediately available for consultation/collaboration.  Leslee Home, M.D.  Reuben Likes, MD 07/05/12 2101

## 2012-07-05 NOTE — ED Notes (Signed)
Fall on ice twice on the same day in March and landed on her R side.  Did not hurt from the fall.  Now having R shoulder and R buttocks onset 2 weeks ago.  No other injuries.  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  Both knees hurting yesterday and they were swollen.  She applied ice and they are a little betterl.

## 2012-07-05 NOTE — ED Provider Notes (Signed)
History    CSN: [id number] Arrival date & time 07/05/12  4098  First MD Initiated Contact with Patient 07/05/12 1923     Chief Complaint  Patient presents with  . Shoulder Pain   (Consider location/radiation/quality/duration/timing/severity/associated sxs/prior Treatment) HPI  42 yo bf presents today with right shoulder pain, and right lateral hip pain.  States she fell on the ice march 2014 and landed on an outstretched right hand and landed on her side.  Currently has shoulder pain with overhead activity and reaching behind her back.  Pain with lifting.  States that she has previously seen [facility] and has had at least 2 shoulder injections over the last few years.  Advised that she may need shoulder surgery at some point.  Right lateral hip pain worse with ambulation and lying on her side.  No groin pain.  Denies upper/lower ext radiculopathy.  Past Medical History  Diagnosis Date  . Seizures    Past Surgical History  Procedure Laterality Date  . Abdominal hysterectomy     History reviewed. No pertinent family history. History  Substance Use Topics  . Smoking status: Never Smoker   . Smokeless tobacco: Not on file  . Alcohol Use: No   OB History   Grav Para Term Preterm Abortions TAB SAB Ect Mult Living                 Review of Systems  HENT: Negative.   Eyes: Negative.   Respiratory: Negative.   Cardiovascular: Negative.   Gastrointestinal: Negative.   Endocrine: Negative.   Musculoskeletal: Positive for arthralgias.  Skin: Negative.   Neurological: Negative.   Psychiatric/Behavioral: Negative.     Allergies  Ampicillin  Home Medications   Current Outpatient Rx  Name  Route  Sig  Dispense  Refill  . carbamazepine (TEGRETOL) 200 MG tablet   Oral   Take 200 mg by mouth 2 (two) times daily between meals.          Marland Kitchen EXPIRED: loratadine (CLARITIN) 10 MG tablet   Oral   Take 1 tablet (10 mg total) by mouth daily.   10 tablet   0    BP  133/80  Pulse 81  Temp(Src) 98.6 F (37 C) (Oral)  Resp 16  SpO2 100% Physical Exam  Constitutional: She is oriented to person, place, and time. She appears well-developed and well-nourished.  HENT:  Head: Normocephalic and atraumatic.  Eyes: EOM are normal. Pupils are equal, round, and reactive to light.  Neck: Normal range of motion.  Pulmonary/Chest: Effort normal.  Musculoskeletal:  Right shoulder pos impingement.  Good range of motion.  Pain with supraspinatus resistance.  Neg drop arm test.  Right hip good painless range of motion.  Mod tender over the greater trochanter bursa.    Neurological: She is alert and oriented to person, place, and time.  Skin: Skin is warm and dry.  Psychiatric: She has a normal mood and affect.    ED Course  Procedures (including critical care time) Labs Reviewed - No data to display No results found. No diagnosis found.   Assessment:  Right shoulder impingement, and right hip greater trochanter bursitis   MDM  Given medrol dose pack to take as directed.  Will schedule appointment with dr [facility] for f/u.  All questions answered.    Meds ordered this encounter  Medications  . methylPREDNISolone (MEDROL DOSEPAK) 4 MG tablet    Sig: 6 day dose pack.  Take as directed  Dispense:  21 tablet    Refill:  0    Zonia Kief, PA-C 07/05/12 2056

## 2012-07-11 NOTE — ED Notes (Signed)
Chart review.

## 2014-01-11 ENCOUNTER — Emergency Department (HOSPITAL_COMMUNITY)
Admission: EM | Admit: 2014-01-11 | Discharge: 2014-01-11 | Disposition: A | Discharge status: Home / Self Care | Payer: 59 | Attending: Emergency Medicine | Admitting: Emergency Medicine

## 2014-01-11 ENCOUNTER — Encounter (HOSPITAL_COMMUNITY): Payer: Self-pay | Admitting: *Deleted

## 2014-01-11 DIAGNOSIS — J029 Acute pharyngitis, unspecified: Secondary | ICD-10-CM | POA: Insufficient documentation

## 2014-01-11 DIAGNOSIS — Z79899 Other long term (current) drug therapy: Secondary | ICD-10-CM | POA: Insufficient documentation

## 2014-01-11 DIAGNOSIS — J028 Acute pharyngitis due to other specified organisms: Secondary | ICD-10-CM

## 2014-01-11 DIAGNOSIS — R05 Cough: Secondary | ICD-10-CM

## 2014-01-11 DIAGNOSIS — Z7952 Long term (current) use of systemic steroids: Secondary | ICD-10-CM | POA: Insufficient documentation

## 2014-01-11 DIAGNOSIS — Z880 Allergy status to penicillin: Secondary | ICD-10-CM | POA: Diagnosis not present

## 2014-01-11 DIAGNOSIS — R059 Cough, unspecified: Secondary | ICD-10-CM

## 2014-01-11 DIAGNOSIS — B9789 Other viral agents as the cause of diseases classified elsewhere: Secondary | ICD-10-CM

## 2014-01-11 LAB — RAPID STREP SCREEN (MED CTR MEBANE ONLY): Streptococcus, Group A Screen (Direct): NEGATIVE

## 2014-01-11 MED ORDER — IBUPROFEN 800 MG PO TABS
800.0000 mg | ORAL_TABLET | Freq: Once | ORAL | Status: AC
  Administered 2014-01-11: 800 mg via ORAL
  Filled 2014-01-11: qty 1

## 2014-01-11 MED ORDER — IBUPROFEN 800 MG PO TABS: 800.0000 mg | ORAL_TABLET | Freq: Three times a day (TID) | ORAL | Status: DC

## 2014-01-11 MED ORDER — GI COCKTAIL ~~LOC~~
30.0000 mL | Freq: Once | ORAL | Status: AC
  Administered 2014-01-11: 30 mL via ORAL
  Filled 2014-01-11: qty 30

## 2014-01-11 NOTE — Discharge Instructions (Signed)
Stay hydrated.   Take motrin for pain.   Try throat lozenges for sore throat.   Follow up with your doctor.   Return to ER if you have fever, worse sore throat, trouble swallowing.

## 2014-01-11 NOTE — ED Notes (Signed)
Dr. Silverio LayYao is at the bedside.

## 2014-01-11 NOTE — ED Notes (Signed)
Pt c/o sore throat and cough the began the beginning of the week

## 2014-01-11 NOTE — ED Provider Notes (Signed)
CSN: [id number]     Arrival date & time 01/11/14  0402 History   First MD Initiated Contact with Patient 01/11/14 0403     Chief Complaint  Patient presents with  . Sore Throat  . Cough     (Consider location/radiation/quality/duration/timing/severity/associated sxs/prior Treatment) The history is provided by the patient.  Carol Prince is a 44 y.o. female history of seizure here presenting with cough and sore throat. Nonproductive cough for the last week. Also sore throat for the last week. 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 Denies any fevers or chills. Denies nausea or vomiting or abdominal pain. Denies being pregnant.    Past Medical History  Diagnosis Date  . Seizures    Past Surgical History  Procedure Laterality Date  . Abdominal hysterectomy     No family history on file. History  Substance Use Topics  . Smoking status: Never Smoker   . Smokeless tobacco: Not on file  . Alcohol Use: No   OB History    No data available     Review of Systems  HENT: Positive for sore throat.   Respiratory: Positive for cough.       Allergies  Ampicillin  Home Medications   Prior to Admission medications   Medication Sig Start Date End Date Taking? Authorizing Provider  carbamazepine (TEGRETOL) 200 MG tablet Take 200 mg by mouth 2 (two) times daily between meals.    Yes Historical Provider, MD  cyclobenzaprine (FLEXERIL) 10 MG tablet Take 10 mg by mouth at bedtime as needed for muscle spasms.   Yes Historical Provider, MD  loratadine (CLARITIN) 10 MG tablet Take 1 tablet (10 mg total) by mouth daily. Patient not taking: Reported on 01/11/2014 11/14/10 11/20/11  Jimmie MollyPaolo Coll, MD  methylPREDNISolone (MEDROL DOSEPAK) 4 MG tablet 6 day dose pack.  Take as directed Patient not taking: Reported on 01/11/2014 07/05/12   Zonia KiefJames Owens, PA-C   BP 126/76 mmHg  Pulse 90  Temp(Src) 98.1 F (36.7 C) (Oral)  Ht 5\' 3"  (1.6 m)  Wt 211 lb (95.709 kg)  BMI 37.39 kg/m2  SpO2  98% Physical Exam  Constitutional: She is oriented to person, place, and time. She appears well-developed and well-nourished.  HENT:  Head: Normocephalic.  Op slightly red, no tonsillar exudates   Eyes: Conjunctivae are normal. Pupils are equal, round, and reactive to light.  Neck:  Mild cervical LAD   Cardiovascular: Normal rate, regular rhythm and normal heart sounds.   Pulmonary/Chest: Effort normal and breath sounds normal. No respiratory distress. She has no wheezes. She has no rales.  Abdominal: Soft. Bowel sounds are normal. She exhibits no distension. There is no tenderness. There is no rebound.  Musculoskeletal: Normal range of motion. She exhibits no edema or tenderness.  Neurological: She is alert and oriented to person, place, and time. No cranial nerve deficit. Coordination normal.  Skin: Skin is warm and dry.  Psychiatric: She has a normal mood and affect. Her behavior is normal. Judgment and thought content normal.  Nursing note and vitals reviewed.   ED Course  Procedures (including critical care time) Labs Review Labs Reviewed  RAPID STREP SCREEN    Imaging Review No results found.   EKG Interpretation None      MDM   Final diagnoses:  None    Carol Prince is a 44 y.o. female here with sore throat, cough. Likely viral. Lungs clear, low centor score. Will get rapid strep.  4:55 AM Strep  neg. Felt better. Recommend motrin, cepacol.      Richardean Canal, MD 01/11/14 [phone]

## 2014-01-13 LAB — CULTURE, GROUP A STREP

## 2014-04-29 ENCOUNTER — Other Ambulatory Visit: Payer: Self-pay | Admitting: Internal Medicine

## 2014-04-29 DIAGNOSIS — Z1231 Encounter for screening mammogram for malignant neoplasm of breast: Secondary | ICD-10-CM

## 2014-05-08 ENCOUNTER — Ambulatory Visit
Admission: RE | Admit: 2014-05-08 | Discharge: 2014-05-08 | Disposition: A | Discharge status: Home / Self Care | Payer: 59 | Source: Ambulatory Visit | Attending: Internal Medicine | Admitting: Internal Medicine

## 2014-05-08 DIAGNOSIS — Z1231 Encounter for screening mammogram for malignant neoplasm of breast: Secondary | ICD-10-CM

## 2014-07-18 ENCOUNTER — Ambulatory Visit: Payer: Self-pay | Admitting: Orthopedic Surgery

## 2014-08-08 ENCOUNTER — Ambulatory Visit: Payer: Self-pay | Admitting: Orthopedic Surgery

## 2014-08-08 NOTE — H&P (Signed)
Carol Prince is an 44 y.o. female.   Chief Complaint: L shoulder pain HPI: The patient is a 44 year old female who presents today for follow up of their shoulder. The patient is being followed for their left shoulder pain. They are 13 1/2 months out from injury (DOI 05/08/13). Symptoms reported today include: pain. Current treatment includes: activity modification and NSAIDs. The following medication has been used for pain control: antiinflammatory medication (ibuprofen). The patient presents today following MRI. Note for "Shoulder complaints": The patient was placed on light duty with no lifting over 5lbs and no overhead work.  Carol Prince follows up with her MRI. The patient has reported persistent pain. She self limits her activity, mainly lives on her right, modifies her left. She is over a year status post her injury.  Past Medical History  Diagnosis Date  . Seizures     Past Surgical History  Procedure Laterality Date  . Abdominal hysterectomy      No family history on file. Social History:  reports that she has never smoked. She does not have any smokeless tobacco history on file. She reports that she does not drink alcohol or use illicit drugs.  Allergies:  Allergies  Allergen Reactions  . Ampicillin Hives and Rash     (Not in a hospital admission)  No results found for this or any previous visit (from the past 48 hour(s)). No results found.  Review of Systems  Constitutional: Negative.   HENT: Negative.   Eyes: Negative.   Respiratory: Negative.   Cardiovascular: Negative.   Gastrointestinal: Negative.   Genitourinary: Negative.   Musculoskeletal: Positive for joint pain.  Skin: Negative.   Neurological: Negative.     There were no vitals taken for this visit. Physical Exam  Constitutional: She is oriented to person, place, and time. She appears well-developed.  HENT:  Head: Normocephalic.  Eyes: Pupils are equal, round, and reactive to light.  Neck:  Normal range of motion.  Cardiovascular: Normal rate.   Respiratory: Effort normal.  GI: Soft.  Musculoskeletal:  She has positive impingement sign, positive second impingement sign of the shoulder. Tender in the anterior subacromial region. Nontender over the [facility]. Limits abduction and internal rotation.  Neurological: She is alert and oriented to person, place, and time.  Skin: Skin is warm and dry.    MRI demonstrates a progressive partial tear 2 x 1.5 cm of the supraspinatus, leading edge of the infraspinatus and some subacromial bursitis, type II acromion.  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  We have discussed options, one is living with her symptoms. She is over 18 months since falling on the floor. She has been in persistent light duty. She is pretty concerned about progressive pain in her shoulder and I do feel that she has demonstrated at least she does have a significant partial tear with perhaps two-thirds of the remaining portion of the supraspinatus that is torn. We discussed living with her symptoms, continuing her current activity restrictions and monitoring of this tear versus subacromial decompression rotator cuff repair. She at this point in time wishes to proceed with surgical intervention. I had a long discussion with the patient concerning the risks and benefits of a left rotator cuff repair, including bleeding, infection, prolonged postoperative recovery, which may require 3 to 5 months until maximum medical improvement. Overnight procedure with initiation of early passive range of motion within physical therapy. Avoid  any active motion for the first six weeks. This is all in an effort to avoid recurrent tear of the rotator cuff and adhesive capsulitis. Return to work without use of the arm can be obtained following two weeks. However, driving will be a  challenge. We also discussed the possibility of requiring implants including bone anchors, as well as an Allograft patch graft if a massive rotator cuff tear is encountered. Removal of any bones for spurs as well as bursitis will be performed during the procedure and also any associated anesthetic complications as well. We discussed shoulder arthroscopy subacromial decompression and mini open rotator cuff repair and regional anesthesia. She has no history of MRSA or anaphylactic reaction to penicillin. We discussed sutures out at two weeks. We can consider return to light duty in a sling in two to four weeks. We discussed physical therapy for six weeks following and then a work conditioning program. I would anticipate maximum medical improvement in three months following the surgical procedure. In the interim, she is to continue no lifting over five pounds, no overhead work. Continue with her current analgesics. If she has any residual questions, concerning that she is free to call. I do feel this is related to her initial injury which is again over almost 14 months from that and got the insurance.  Plan left shoulder arthroscopy, SAD, mini-open RCR  BISSELL, Carol M. PA-C for Dr. Shelle Iron 08/08/2014, 6:01 PM

## 2014-08-12 NOTE — Patient Instructions (Signed)
Carol Prince  08/12/2014   Your procedure is scheduled on:    08/15/2014    Report to [facility] Main  Entrance take [place]  elevators to 3rd floor to  Short Stay Center at        [phone].  Call this number if you have problems the morning of surgery [phone]   Remember: ONLY 1 PERSON MAY GO WITH YOU TO SHORT STAY TO GET  READY MORNING OF YOUR SURGERY.  Do not eat food or drink liquids :After Midnight.     Take these medicines the morning of surgery with A SIP OF WATER: Tegretol                                You may not have any metal on your body including hair pins and              piercings  Do not wear jewelry, make-up, lotions, powders or perfumes, deodorant             Do not wear nail polish.  Do not shave  48 hours prior to surgery.                Do not bring valuables to the hospital.  IS NOT             RESPONSIBLE   FOR VALUABLES.  Contacts, dentures or bridgework may not be worn into surgery.  Leave suitcase in the car. After surgery it may be brought to your room.        Special Instructions: coughing and deep breathing exercises, leg exercises               Please read over the following fact sheets you were given: _____________________________________________________________________             [facility] - Preparing for Surgery Before surgery, you can play an important role.  Because skin is not sterile, your skin needs to be as free of germs as possible.  You can reduce the number of germs on your skin by washing with CHG (chlorahexidine gluconate) soap before surgery.  CHG is an antiseptic cleaner which kills germs and bonds with the skin to continue killing germs even after washing. Please DO NOT use if you have an allergy to CHG or antibacterial soaps.  If your skin becomes reddened/irritated stop using the CHG and inform your nurse when you arrive at Short Stay. Do not shave (including legs and underarms) for at least 48  hours prior to the first CHG shower.  You may shave your face/neck. Please follow these instructions carefully:  1.  Shower with CHG Soap the night before surgery and the  morning of Surgery.  2.  If you choose to wash your hair, wash your hair first as usual with your  normal  shampoo.  3.  After you shampoo, rinse your hair and body thoroughly to remove the  shampoo.                           4.  Use CHG as you would any other liquid soap.  You can apply chg directly  to the skin and wash  Gently with a scrungie or clean washcloth.  5.  Apply the CHG Soap to your body ONLY FROM THE NECK DOWN.   Do not use on face/ open                           Wound or open sores. Avoid contact with eyes, ears mouth and genitals (private parts).                       Wash face,  Genitals (private parts) with your normal soap.             6.  Wash thoroughly, paying special attention to the area where your surgery  will be performed.  7.  Thoroughly rinse your body with warm water from the neck down.  8.  DO NOT shower/wash with your normal soap after using and rinsing off  the CHG Soap.                9.  Pat yourself dry with a clean towel.            10.  Wear clean pajamas.            11.  Place clean sheets on your bed the night of your first shower and do not  sleep with pets. Day of Surgery : Do not apply any lotions/deodorants the morning of surgery.  Please wear clean clothes to the hospital/surgery center.  FAILURE TO FOLLOW THESE INSTRUCTIONS MAY RESULT IN THE CANCELLATION OF YOUR SURGERY PATIENT SIGNATURE_________________________________  NURSE SIGNATURE__________________________________  ________________________________________________________________________   Carol Prince  An incentive spirometer is a tool that can help keep your lungs clear and active. This tool measures how well you are filling your lungs with each breath. Taking long deep breaths may help  reverse or decrease the chance of developing breathing (pulmonary) problems (especially infection) following:  A long period of time when you are unable to move or be active. BEFORE THE PROCEDURE   If the spirometer includes an indicator to show your best effort, your nurse or respiratory therapist will set it to a desired goal.  If possible, sit up straight or lean slightly forward. Try not to slouch.  Hold the incentive spirometer in an upright position. INSTRUCTIONS FOR USE  1. Sit on the edge of your bed if possible, or sit up as far as you can in bed or on a chair. 2. Hold the incentive spirometer in an upright position. 3. Breathe out normally. 4. Place the mouthpiece in your mouth and seal your lips tightly around it. 5. Breathe in slowly and as deeply as possible, raising the piston or the ball toward the top of the column. 6. Hold your breath for 3-5 seconds or for as long as possible. Allow the piston or ball to fall to the bottom of the column. 7. Remove the mouthpiece from your mouth and breathe out normally. 8. Rest for a few seconds and repeat Steps 1 through 7 at least 10 times every 1-2 hours when you are awake. Take your time and take a few normal breaths between deep breaths. 9. The spirometer may include an indicator to show your best effort. Use the indicator as a goal to work toward during each repetition. 10. After each set of 10 deep breaths, practice coughing to be sure your lungs are clear. If you have an incision (the cut made at the time of surgery),  support your incision when coughing by placing a pillow or rolled up towels firmly against it. Once you are able to get out of bed, walk around indoors and cough well. You may stop using the incentive spirometer when instructed by your caregiver.  RISKS AND COMPLICATIONS  Take your time so you do not get dizzy or light-headed.  If you are in pain, you may need to take or ask for pain medication before doing incentive  spirometry. It is harder to take a deep breath if you are having pain. AFTER USE  Rest and breathe slowly and easily.  It can be helpful to keep track of a log of your progress. Your caregiver can provide you with a simple table to help with this. If you are using the spirometer at home, follow these instructions: [place] IF:   You are having difficultly using the spirometer.  You have trouble using the spirometer as often as instructed.  Your pain medication is not giving enough relief while using the spirometer.  You develop fever of 100.5 F (38.1 C) or higher. SEEK IMMEDIATE MEDICAL CARE IF:   You cough up bloody sputum that had not been present before.  You develop fever of 102 F (38.9 C) or greater.  You develop worsening pain at or near the incision site. MAKE SURE YOU:   Understand these instructions.  Will watch your condition.  Will get help right away if you are not doing well or get worse. Document Released: 05/03/2006 Document Revised: 03/15/2011 Document Reviewed: 07/04/2006 [place] Patient Information 2014 [employer], [place].   ________________________________________________________________________

## 2014-08-13 ENCOUNTER — Encounter (HOSPITAL_COMMUNITY)
Admission: RE | Admit: 2014-08-13 | Discharge: 2014-08-13 | Disposition: A | Discharge status: Home / Self Care | Payer: Worker's Compensation | Source: Ambulatory Visit | Attending: Specialist | Admitting: Specialist

## 2014-08-13 ENCOUNTER — Encounter (HOSPITAL_COMMUNITY): Payer: Self-pay

## 2014-08-13 DIAGNOSIS — W19XXXA Unspecified fall, initial encounter: Secondary | ICD-10-CM | POA: Diagnosis not present

## 2014-08-13 DIAGNOSIS — Y9389 Activity, other specified: Secondary | ICD-10-CM | POA: Diagnosis not present

## 2014-08-13 DIAGNOSIS — Z6836 Body mass index (BMI) 36.0-36.9, adult: Secondary | ICD-10-CM | POA: Diagnosis not present

## 2014-08-13 DIAGNOSIS — K219 Gastro-esophageal reflux disease without esophagitis: Secondary | ICD-10-CM | POA: Diagnosis not present

## 2014-08-13 DIAGNOSIS — M7552 Bursitis of left shoulder: Secondary | ICD-10-CM | POA: Diagnosis not present

## 2014-08-13 DIAGNOSIS — Y999 Unspecified external cause status: Secondary | ICD-10-CM | POA: Diagnosis not present

## 2014-08-13 DIAGNOSIS — M7542 Impingement syndrome of left shoulder: Secondary | ICD-10-CM | POA: Diagnosis present

## 2014-08-13 DIAGNOSIS — R569 Unspecified convulsions: Secondary | ICD-10-CM | POA: Diagnosis not present

## 2014-08-13 DIAGNOSIS — S46012A Strain of muscle(s) and tendon(s) of the rotator cuff of left shoulder, initial encounter: Secondary | ICD-10-CM | POA: Diagnosis not present

## 2014-08-13 DIAGNOSIS — E669 Obesity, unspecified: Secondary | ICD-10-CM | POA: Diagnosis not present

## 2014-08-13 DIAGNOSIS — Y929 Unspecified place or not applicable: Secondary | ICD-10-CM | POA: Diagnosis not present

## 2014-08-13 DIAGNOSIS — Z881 Allergy status to other antibiotic agents status: Secondary | ICD-10-CM | POA: Diagnosis not present

## 2014-08-13 HISTORY — DX: Gastro-esophageal reflux disease without esophagitis: K21.9

## 2014-08-13 LAB — CBC
HCT: 38.6 % (ref 36.0–46.0)
Hemoglobin: 13.1 g/dL (ref 12.0–15.0)
MCH: 28.4 pg (ref 26.0–34.0)
MCHC: 33.9 g/dL (ref 30.0–36.0)
MCV: 83.7 fL (ref 78.0–100.0)
Platelets: 231 10*3/uL (ref 150–400)
RBC: 4.61 MIL/uL (ref 3.87–5.11)
RDW: 13.7 % (ref 11.5–15.5)
WBC: 4.7 10*3/uL (ref 4.0–10.5)

## 2014-08-15 ENCOUNTER — Ambulatory Visit (HOSPITAL_COMMUNITY): Payer: Worker's Compensation | Admitting: Anesthesiology

## 2014-08-15 ENCOUNTER — Encounter (HOSPITAL_COMMUNITY): Payer: Self-pay | Admitting: Anesthesiology

## 2014-08-15 ENCOUNTER — Ambulatory Visit (HOSPITAL_COMMUNITY)
Admission: RE | Admit: 2014-08-15 | Discharge: 2014-08-16 | Disposition: A | Discharge status: Home / Self Care | Payer: Worker's Compensation | Source: Ambulatory Visit | Attending: Specialist | Admitting: Specialist

## 2014-08-15 ENCOUNTER — Encounter (HOSPITAL_COMMUNITY)
Admission: RE | Disposition: A | Discharge status: Home / Self Care | Payer: Self-pay | Source: Ambulatory Visit | Attending: Specialist

## 2014-08-15 DIAGNOSIS — Y929 Unspecified place or not applicable: Secondary | ICD-10-CM | POA: Insufficient documentation

## 2014-08-15 DIAGNOSIS — K219 Gastro-esophageal reflux disease without esophagitis: Secondary | ICD-10-CM | POA: Insufficient documentation

## 2014-08-15 DIAGNOSIS — Y999 Unspecified external cause status: Secondary | ICD-10-CM | POA: Insufficient documentation

## 2014-08-15 DIAGNOSIS — M7552 Bursitis of left shoulder: Secondary | ICD-10-CM | POA: Insufficient documentation

## 2014-08-15 DIAGNOSIS — W19XXXA Unspecified fall, initial encounter: Secondary | ICD-10-CM | POA: Insufficient documentation

## 2014-08-15 DIAGNOSIS — Y9389 Activity, other specified: Secondary | ICD-10-CM | POA: Insufficient documentation

## 2014-08-15 DIAGNOSIS — M7542 Impingement syndrome of left shoulder: Secondary | ICD-10-CM | POA: Diagnosis not present

## 2014-08-15 DIAGNOSIS — S46012A Strain of muscle(s) and tendon(s) of the rotator cuff of left shoulder, initial encounter: Secondary | ICD-10-CM | POA: Diagnosis not present

## 2014-08-15 DIAGNOSIS — Z881 Allergy status to other antibiotic agents status: Secondary | ICD-10-CM | POA: Insufficient documentation

## 2014-08-15 DIAGNOSIS — R569 Unspecified convulsions: Secondary | ICD-10-CM | POA: Insufficient documentation

## 2014-08-15 DIAGNOSIS — Z6836 Body mass index (BMI) 36.0-36.9, adult: Secondary | ICD-10-CM | POA: Insufficient documentation

## 2014-08-15 DIAGNOSIS — E669 Obesity, unspecified: Secondary | ICD-10-CM | POA: Insufficient documentation

## 2014-08-15 DIAGNOSIS — M75100 Unspecified rotator cuff tear or rupture of unspecified shoulder, not specified as traumatic: Secondary | ICD-10-CM | POA: Diagnosis present

## 2014-08-15 HISTORY — PX: SHOULDER ARTHROSCOPY WITH SUBACROMIAL DECOMPRESSION AND OPEN ROTATOR C: SHX5688

## 2014-08-15 LAB — CBC
HCT: 37.3 % (ref 36.0–46.0)
Hemoglobin: 12.6 g/dL (ref 12.0–15.0)
MCH: 28.4 pg (ref 26.0–34.0)
MCHC: 33.8 g/dL (ref 30.0–36.0)
MCV: 84.2 fL (ref 78.0–100.0)
Platelets: 218 10*3/uL (ref 150–400)
RBC: 4.43 MIL/uL (ref 3.87–5.11)
RDW: 13.7 % (ref 11.5–15.5)
WBC: 6.1 10*3/uL (ref 4.0–10.5)

## 2014-08-15 SURGERY — SHOULDER ARTHROSCOPY WITH SUBACROMIAL DECOMPRESSION AND OPEN ROTATOR CUFF REPAIR, OPEN BICEPS TENDON REPAIR
Anesthesia: General | Site: Shoulder | Laterality: Left

## 2014-08-15 MED ORDER — MENTHOL 3 MG MT LOZG: 1.0000 | LOZENGE | OROMUCOSAL | Status: DC | PRN

## 2014-08-15 MED ORDER — PHENOL 1.4 % MT LIQD: 1.0000 | OROMUCOSAL | Status: DC | PRN

## 2014-08-15 MED ORDER — ROCURONIUM BROMIDE 100 MG/10ML IV SOLN
INTRAVENOUS | Status: DC | PRN
  Administered 2014-08-15: 30 mg via INTRAVENOUS

## 2014-08-15 MED ORDER — EPINEPHRINE HCL 1 MG/ML IJ SOLN
INTRAMUSCULAR | Status: DC | PRN
  Administered 2014-08-15: 1 mg

## 2014-08-15 MED ORDER — EPINEPHRINE HCL 1 MG/ML IJ SOLN
INTRAMUSCULAR | Status: AC
  Filled 2014-08-15: qty 1

## 2014-08-15 MED ORDER — ONDANSETRON HCL 4 MG/2ML IJ SOLN
INTRAMUSCULAR | Status: AC
  Filled 2014-08-15: qty 2

## 2014-08-15 MED ORDER — CLINDAMYCIN PHOSPHATE 900 MG/50ML IV SOLN
900.0000 mg | INTRAVENOUS | Status: AC
  Administered 2014-08-15: 900 mg via INTRAVENOUS

## 2014-08-15 MED ORDER — GLYCOPYRROLATE 0.2 MG/ML IJ SOLN
INTRAMUSCULAR | Status: DC | PRN
  Administered 2014-08-15: .6 mg via INTRAVENOUS

## 2014-08-15 MED ORDER — MIDAZOLAM HCL 5 MG/5ML IJ SOLN
INTRAMUSCULAR | Status: DC | PRN
  Administered 2014-08-15: 2 mg via INTRAVENOUS

## 2014-08-15 MED ORDER — DOCUSATE SODIUM 100 MG PO CAPS
100.0000 mg | ORAL_CAPSULE | Freq: Two times a day (BID) | ORAL | Status: DC
  Administered 2014-08-15 – 2014-08-16 (×2): 100 mg via ORAL

## 2014-08-15 MED ORDER — ROCURONIUM BROMIDE 100 MG/10ML IV SOLN
INTRAVENOUS | Status: AC
  Filled 2014-08-15: qty 1

## 2014-08-15 MED ORDER — HYDROMORPHONE HCL 1 MG/ML IJ SOLN
INTRAMUSCULAR | Status: AC
  Filled 2014-08-15: qty 1

## 2014-08-15 MED ORDER — LIDOCAINE HCL (CARDIAC) 20 MG/ML IV SOLN
INTRAVENOUS | Status: AC
  Filled 2014-08-15: qty 5

## 2014-08-15 MED ORDER — SODIUM CHLORIDE 0.9 % IR SOLN
Status: AC
  Filled 2014-08-15: qty 1

## 2014-08-15 MED ORDER — DEXAMETHASONE SODIUM PHOSPHATE 10 MG/ML IJ SOLN
INTRAMUSCULAR | Status: AC
  Filled 2014-08-15: qty 1

## 2014-08-15 MED ORDER — SENNOSIDES-DOCUSATE SODIUM 8.6-50 MG PO TABS: 1.0000 | ORAL_TABLET | Freq: Every evening | ORAL | Status: DC | PRN

## 2014-08-15 MED ORDER — DEXAMETHASONE SODIUM PHOSPHATE 10 MG/ML IJ SOLN
INTRAMUSCULAR | Status: DC | PRN
  Administered 2014-08-15: 10 mg via INTRAVENOUS

## 2014-08-15 MED ORDER — OXYCODONE-ACETAMINOPHEN 5-325 MG PO TABS: 1.0000 | ORAL_TABLET | ORAL | Status: DC | PRN

## 2014-08-15 MED ORDER — METHOCARBAMOL 500 MG PO TABS
500.0000 mg | ORAL_TABLET | Freq: Four times a day (QID) | ORAL | Status: DC | PRN
  Administered 2014-08-16: 500 mg via ORAL
  Filled 2014-08-15: qty 1

## 2014-08-15 MED ORDER — BISACODYL 5 MG PO TBEC: 5.0000 mg | DELAYED_RELEASE_TABLET | Freq: Every day | ORAL | Status: DC | PRN

## 2014-08-15 MED ORDER — ONDANSETRON HCL 4 MG PO TABS: 4.0000 mg | ORAL_TABLET | Freq: Four times a day (QID) | ORAL | Status: DC | PRN

## 2014-08-15 MED ORDER — ACETAMINOPHEN 325 MG PO TABS: 650.0000 mg | ORAL_TABLET | Freq: Four times a day (QID) | ORAL | Status: DC | PRN

## 2014-08-15 MED ORDER — KETOROLAC TROMETHAMINE 15 MG/ML IJ SOLN
15.0000 mg | Freq: Four times a day (QID) | INTRAMUSCULAR | Status: AC
  Administered 2014-08-15 – 2014-08-16 (×4): 15 mg via INTRAVENOUS
  Filled 2014-08-15 (×4): qty 1

## 2014-08-15 MED ORDER — METOCLOPRAMIDE HCL 10 MG PO TABS: 5.0000 mg | ORAL_TABLET | Freq: Three times a day (TID) | ORAL | Status: DC | PRN

## 2014-08-15 MED ORDER — ENOXAPARIN SODIUM 40 MG/0.4ML ~~LOC~~ SOLN
40.0000 mg | SUBCUTANEOUS | Status: DC
  Administered 2014-08-16: 40 mg via SUBCUTANEOUS
  Filled 2014-08-15 (×2): qty 0.4

## 2014-08-15 MED ORDER — ACETAMINOPHEN 650 MG RE SUPP: 650.0000 mg | Freq: Four times a day (QID) | RECTAL | Status: DC | PRN

## 2014-08-15 MED ORDER — NEOSTIGMINE METHYLSULFATE 10 MG/10ML IV SOLN
INTRAVENOUS | Status: DC | PRN
  Administered 2014-08-15: 4 mg via INTRAVENOUS

## 2014-08-15 MED ORDER — PROPOFOL 10 MG/ML IV BOLUS
INTRAVENOUS | Status: AC
  Filled 2014-08-15: qty 20

## 2014-08-15 MED ORDER — HYDROMORPHONE HCL 1 MG/ML IJ SOLN
1.0000 mg | INTRAMUSCULAR | Status: DC | PRN
  Administered 2014-08-15: 1 mg via INTRAVENOUS
  Filled 2014-08-15: qty 1

## 2014-08-15 MED ORDER — METHOCARBAMOL 1000 MG/10ML IJ SOLN
500.0000 mg | Freq: Four times a day (QID) | INTRAVENOUS | Status: DC | PRN
  Administered 2014-08-15: 500 mg via INTRAVENOUS
  Filled 2014-08-15 (×2): qty 5

## 2014-08-15 MED ORDER — PHENYLEPHRINE HCL 10 MG/ML IJ SOLN
INTRAMUSCULAR | Status: AC
  Filled 2014-08-15: qty 1

## 2014-08-15 MED ORDER — LACTATED RINGERS IR SOLN
Status: DC | PRN
  Administered 2014-08-15: 3000 mL

## 2014-08-15 MED ORDER — GLYCOPYRROLATE 0.2 MG/ML IJ SOLN
INTRAMUSCULAR | Status: AC
Start: 2014-08-15 — End: 2014-08-15
  Filled 2014-08-15: qty 3

## 2014-08-15 MED ORDER — NEOSTIGMINE METHYLSULFATE 10 MG/10ML IV SOLN
INTRAVENOUS | Status: AC
  Filled 2014-08-15: qty 1

## 2014-08-15 MED ORDER — SUCCINYLCHOLINE CHLORIDE 20 MG/ML IJ SOLN
INTRAMUSCULAR | Status: DC | PRN
  Administered 2014-08-15: 100 mg via INTRAVENOUS

## 2014-08-15 MED ORDER — OXYCODONE HCL 5 MG/5ML PO SOLN
5.0000 mg | Freq: Once | ORAL | Status: DC | PRN
  Filled 2014-08-15: qty 5

## 2014-08-15 MED ORDER — OXYCODONE HCL 5 MG PO TABS: 5.0000 mg | ORAL_TABLET | Freq: Once | ORAL | Status: DC | PRN

## 2014-08-15 MED ORDER — KCL IN DEXTROSE-NACL 20-5-0.45 MEQ/L-%-% IV SOLN
INTRAVENOUS | Status: AC
  Administered 2014-08-15: 13:00:00 via INTRAVENOUS
  Filled 2014-08-15 (×2): qty 1000

## 2014-08-15 MED ORDER — FENTANYL CITRATE (PF) 250 MCG/5ML IJ SOLN
INTRAMUSCULAR | Status: AC
  Filled 2014-08-15: qty 25

## 2014-08-15 MED ORDER — LACTATED RINGERS IV SOLN
INTRAVENOUS | Status: DC
  Administered 2014-08-15: 07:00:00 via INTRAVENOUS

## 2014-08-15 MED ORDER — METHOCARBAMOL 500 MG PO TABS: 500.0000 mg | ORAL_TABLET | Freq: Three times a day (TID) | ORAL | Status: DC

## 2014-08-15 MED ORDER — CLINDAMYCIN PHOSPHATE 900 MG/50ML IV SOLN
900.0000 mg | Freq: Four times a day (QID) | INTRAVENOUS | Status: AC
  Administered 2014-08-15 (×2): 900 mg via INTRAVENOUS
  Filled 2014-08-15 (×2): qty 50

## 2014-08-15 MED ORDER — FENTANYL CITRATE (PF) 100 MCG/2ML IJ SOLN
INTRAMUSCULAR | Status: DC | PRN
  Administered 2014-08-15: 100 ug via INTRAVENOUS
  Administered 2014-08-15: 50 ug via INTRAVENOUS

## 2014-08-15 MED ORDER — PROMETHAZINE HCL 25 MG/ML IJ SOLN: 6.2500 mg | INTRAMUSCULAR | Status: DC | PRN

## 2014-08-15 MED ORDER — HYDROMORPHONE HCL 1 MG/ML IJ SOLN
0.2500 mg | INTRAMUSCULAR | Status: DC | PRN
  Administered 2014-08-15 (×2): 0.5 mg via INTRAVENOUS

## 2014-08-15 MED ORDER — ONDANSETRON HCL 4 MG/2ML IJ SOLN
4.0000 mg | Freq: Four times a day (QID) | INTRAMUSCULAR | Status: DC | PRN
  Administered 2014-08-15: 4 mg via INTRAVENOUS
  Filled 2014-08-15: qty 2

## 2014-08-15 MED ORDER — CARBAMAZEPINE 200 MG PO TABS
200.0000 mg | ORAL_TABLET | Freq: Two times a day (BID) | ORAL | Status: DC
  Administered 2014-08-15 – 2014-08-16 (×2): 200 mg via ORAL
  Filled 2014-08-15 (×4): qty 1

## 2014-08-15 MED ORDER — CLINDAMYCIN PHOSPHATE 900 MG/50ML IV SOLN
INTRAVENOUS | Status: AC
  Filled 2014-08-15: qty 50

## 2014-08-15 MED ORDER — ONDANSETRON HCL 4 MG/2ML IJ SOLN
INTRAMUSCULAR | Status: DC | PRN
  Administered 2014-08-15: 4 mg via INTRAVENOUS

## 2014-08-15 MED ORDER — BUPIVACAINE-EPINEPHRINE (PF) 0.5% -1:200000 IJ SOLN
INTRAMUSCULAR | Status: AC
  Filled 2014-08-15: qty 30

## 2014-08-15 MED ORDER — METOCLOPRAMIDE HCL 5 MG/ML IJ SOLN
5.0000 mg | Freq: Three times a day (TID) | INTRAMUSCULAR | Status: DC | PRN
  Administered 2014-08-15: 10 mg via INTRAVENOUS
  Filled 2014-08-15: qty 2

## 2014-08-15 MED ORDER — RISAQUAD PO CAPS
1.0000 | ORAL_CAPSULE | Freq: Every day | ORAL | Status: DC
  Administered 2014-08-15 – 2014-08-16 (×2): 1 via ORAL
  Filled 2014-08-15 (×2): qty 1

## 2014-08-15 MED ORDER — MIDAZOLAM HCL 2 MG/2ML IJ SOLN
INTRAMUSCULAR | Status: AC
  Filled 2014-08-15: qty 4

## 2014-08-15 MED ORDER — BUPIVACAINE-EPINEPHRINE 0.5% -1:200000 IJ SOLN
INTRAMUSCULAR | Status: DC | PRN
  Administered 2014-08-15: 10 mL

## 2014-08-15 MED ORDER — DOCUSATE SODIUM 100 MG PO CAPS: 100.0000 mg | ORAL_CAPSULE | Freq: Two times a day (BID) | ORAL | Status: DC | PRN

## 2014-08-15 MED ORDER — OXYCODONE HCL 5 MG PO TABS
5.0000 mg | ORAL_TABLET | ORAL | Status: DC | PRN
  Administered 2014-08-16 (×2): 10 mg via ORAL
  Filled 2014-08-15 (×3): qty 2

## 2014-08-15 SURGICAL SUPPLY — 61 items
ANCHOR NEEDLE 9/16 CIR SZ 8 (NEEDLE) ×2 IMPLANT
ANCHOR PEEK 4.75X19.1 SWLK C (Anchor) ×4 IMPLANT
BLADE CUDA SHAVER 3.5 (BLADE) ×2 IMPLANT
BLADE OSCILLATING/SAGITTAL (BLADE)
BLADE SURG SZ11 CARB STEEL (BLADE) ×2 IMPLANT
BLADE SW THK.38XMED LNG THN (BLADE) IMPLANT
BUR OVAL 4.0 (BURR) IMPLANT
CANNULA ACUFO 5X76 (CANNULA) ×2 IMPLANT
CHLORAPREP W/TINT 26ML (MISCELLANEOUS) IMPLANT
CLOTH 2% CHLOROHEXIDINE 3PK (PERSONAL CARE ITEMS) ×2 IMPLANT
DRAPE ORTHO SPLIT 77X108 STRL (DRAPES) ×1
DRAPE POUCH INSTRU U-SHP 10X18 (DRAPES) ×2 IMPLANT
DRAPE STERI 35X30 U-POUCH (DRAPES) ×2 IMPLANT
DRAPE SURG ORHT 6 SPLT 77X108 (DRAPES) ×1 IMPLANT
DRSG AQUACEL AG ADV 3.5X 4 (GAUZE/BANDAGES/DRESSINGS) ×2 IMPLANT
DRSG AQUACEL AG ADV 3.5X 6 (GAUZE/BANDAGES/DRESSINGS) ×2 IMPLANT
DRSG PAD ABDOMINAL 8X10 ST (GAUZE/BANDAGES/DRESSINGS) IMPLANT
DURAPREP 26ML APPLICATOR (WOUND CARE) ×2 IMPLANT
ELECT NEEDLE TIP 2.8 STRL (NEEDLE) ×2 IMPLANT
ELECT REM PT RETURN 9FT ADLT (ELECTROSURGICAL) ×2
ELECTRODE REM PT RTRN 9FT ADLT (ELECTROSURGICAL) ×1 IMPLANT
GLOVE BIOGEL M 7.0 STRL (GLOVE) ×2 IMPLANT
GLOVE BIOGEL PI IND STRL 6.5 (GLOVE) ×1 IMPLANT
GLOVE BIOGEL PI IND STRL 7.0 (GLOVE) ×1 IMPLANT
GLOVE BIOGEL PI IND STRL 7.5 (GLOVE) ×1 IMPLANT
GLOVE BIOGEL PI INDICATOR 6.5 (GLOVE) ×1
GLOVE BIOGEL PI INDICATOR 7.0 (GLOVE) ×1
GLOVE BIOGEL PI INDICATOR 7.5 (GLOVE) ×1
GLOVE SURG SS PI 6.5 STRL IVOR (GLOVE) ×2 IMPLANT
GLOVE SURG SS PI 7.5 STRL IVOR (GLOVE) IMPLANT
GLOVE SURG SS PI 8.0 STRL IVOR (GLOVE) ×4 IMPLANT
GOWN STRL REUS W/TWL LRG LVL3 (GOWN DISPOSABLE) ×2 IMPLANT
GOWN STRL REUS W/TWL XL LVL3 (GOWN DISPOSABLE) ×4 IMPLANT
KIT BASIN OR (CUSTOM PROCEDURE TRAY) ×2 IMPLANT
KIT POSITION SHOULDER SCHLEI (MISCELLANEOUS) ×2 IMPLANT
MANIFOLD NEPTUNE II (INSTRUMENTS) ×2 IMPLANT
NEEDLE SCORPION MULTI FIRE (NEEDLE) ×2 IMPLANT
NEEDLE SPNL 18GX3.5 QUINCKE PK (NEEDLE) ×2 IMPLANT
PACK SHOULDER (CUSTOM PROCEDURE TRAY) ×2 IMPLANT
POSITIONER SURGICAL ARM (MISCELLANEOUS) IMPLANT
PUSHLOCK PEEK 4.5X24 (Orthopedic Implant) ×2 IMPLANT
SET ARTHROSCOPY TUBING (MISCELLANEOUS) ×1
SET ARTHROSCOPY TUBING LN (MISCELLANEOUS) ×1 IMPLANT
SLING ARM IMMOBILIZER LRG (SOFTGOODS) ×2 IMPLANT
SLING ARM IMMOBILIZER MED (SOFTGOODS) IMPLANT
STRIP CLOSURE SKIN 1/2X4 (GAUZE/BANDAGES/DRESSINGS) ×2 IMPLANT
SUCTION FRAZIER TIP 10 FR DISP (SUCTIONS) ×2 IMPLANT
SUT ETHIBOND NAB CT1 #1 30IN (SUTURE) IMPLANT
SUT ETHILON 4 0 PS 2 18 (SUTURE) ×2 IMPLANT
SUT FIBERWIRE #2 38 T-5 BLUE (SUTURE) ×2
SUT PROLENE 3 0 PS 2 (SUTURE) ×2 IMPLANT
SUT TIGER TAPE 7 IN WHITE (SUTURE) IMPLANT
SUT VIC AB 1-0 CT2 27 (SUTURE) ×2 IMPLANT
SUT VIC AB 2-0 CT2 27 (SUTURE) ×2 IMPLANT
SUT VICRYL 0-0 OS 2 NEEDLE (SUTURE) ×2 IMPLANT
SUTURE FIBERWR #2 38 T-5 BLUE (SUTURE) ×1 IMPLANT
TAPE FIBER 2MM 7IN #2 BLUE (SUTURE) ×2 IMPLANT
TOWEL OR 17X26 10 PK STRL BLUE (TOWEL DISPOSABLE) ×2 IMPLANT
TOWEL OR NON WOVEN STRL DISP B (DISPOSABLE) ×2 IMPLANT
TUBING CONNECTING 10 (TUBING) ×2 IMPLANT
WAND 90 DEG TURBOVAC W/CORD (SURGICAL WAND) IMPLANT

## 2014-08-15 NOTE — Brief Op Note (Signed)
08/15/2014  9:38 AM  PATIENT:  [name]  44 y.o. female  PRE-OPERATIVE DIAGNOSIS:  left rotator cuff tear  POST-OPERATIVE DIAGNOSIS:  left rotator cuff tear  PROCEDURE:  Procedure(s): LEFT SHOULDER ARTHROSCOPY WITH SUBACROMIAL DECOMPRESSION AND MINI OPEN ROTATOR CUFF REPAIR (Left)  SURGEON:  Surgeon(s) and Role:    * Jene Every, MD - Primary  PHYSICIAN ASSISTANT:   ASSISTANTSSu Hilt    ANESTHESIA:   general  EBL:     BLOOD ADMINISTERED:none  DRAINS: none   LOCAL MEDICATIONS USED:  MARCAINE     SPECIMEN:  No Specimen  DISPOSITION OF SPECIMEN:  N/A  COUNTS:  YES  TOURNIQUET:  * No tourniquets in log *  DICTATION: .Other Dictation: Dictation Number [id number]  PLAN OF CARE: Admit for overnight observation  PATIENT DISPOSITION:  PACU - hemodynamically stable.   Delay start of Pharmacological VTE agent (>24hrs) due to surgical blood loss or risk of bleeding: no

## 2014-08-15 NOTE — H&P (View-Only) (Signed)
Carol Prince is an 44 y.o. female.   Chief Complaint: L shoulder pain HPI: The patient is a 44 year old female who presents today for follow up of their shoulder. The patient is being followed for their left shoulder pain. They are 13 1/2 months out from injury (DOI 05/08/13). Symptoms reported today include: pain. Current treatment includes: activity modification and NSAIDs. The following medication has been used for pain control: antiinflammatory medication (ibuprofen). The patient presents today following MRI. Note for "Shoulder complaints": The patient was placed on light duty with no lifting over 5lbs and no overhead work.  [name] follows up with her MRI. The patient has reported persistent pain. She self limits her activity, mainly lives on her right, modifies her left. She is over a year status post her injury.  Past Medical History  Diagnosis Date  . Seizures     Past Surgical History  Procedure Laterality Date  . Abdominal hysterectomy      No family history on file. Social History:  reports that she has never smoked. She does not have any smokeless tobacco history on file. She reports that she does not drink alcohol or use illicit drugs.  Allergies:  Allergies  Allergen Reactions  . Ampicillin Hives and Rash     (Not in a hospital admission)  No results found for this or any previous visit (from the past 48 hour(s)). No results found.  Review of Systems  Constitutional: Negative.   HENT: Negative.   Eyes: Negative.   Respiratory: Negative.   Cardiovascular: Negative.   Gastrointestinal: Negative.   Genitourinary: Negative.   Musculoskeletal: Positive for joint pain.  Skin: Negative.   Neurological: Negative.     There were no vitals taken for this visit. Physical Exam  Constitutional: She is oriented to person, place, and time. She appears well-developed.  HENT:  Head: Normocephalic.  Eyes: Pupils are equal, round, and reactive to light.  Neck:  Normal range of motion.  Cardiovascular: Normal rate.   Respiratory: Effort normal.  GI: Soft.  Musculoskeletal:  She has positive impingement sign, positive second impingement sign of the shoulder. Tender in the anterior subacromial region. Nontender over the [facility]. Limits abduction and internal rotation.  Neurological: She is alert and oriented to person, place, and time.  Skin: Skin is warm and dry.    MRI demonstrates a progressive partial tear 2 x 1.5 cm of the supraspinatus, leading edge of the infraspinatus and some subacromial bursitis, type II acromion.  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  We have discussed options, one is living with her symptoms. She is over 18 months since falling on the floor. She has been in persistent light duty. She is pretty concerned about progressive pain in her shoulder and I do feel that she has demonstrated at least she does have a significant partial tear with perhaps two-thirds of the remaining portion of the supraspinatus that is torn. We discussed living with her symptoms, continuing her current activity restrictions and monitoring of this tear versus subacromial decompression rotator cuff repair. She at this point in time wishes to proceed with surgical intervention. I had a long discussion with the patient concerning the risks and benefits of a left rotator cuff repair, including bleeding, infection, prolonged postoperative recovery, which may require 3 to 5 months until maximum medical improvement. Overnight procedure with initiation of early passive range of motion within physical therapy. Avoid  any active motion for the first six weeks. This is all in an effort to avoid recurrent tear of the rotator cuff and adhesive capsulitis. Return to work without use of the arm can be obtained following two weeks. However, driving will be a  challenge. We also discussed the possibility of requiring implants including bone anchors, as well as an Allograft patch graft if a massive rotator cuff tear is encountered. Removal of any bones for spurs as well as bursitis will be performed during the procedure and also any associated anesthetic complications as well. We discussed shoulder arthroscopy subacromial decompression and mini open rotator cuff repair and regional anesthesia. She has no history of MRSA or anaphylactic reaction to penicillin. We discussed sutures out at two weeks. We can consider return to light duty in a sling in two to four weeks. We discussed physical therapy for six weeks following and then a work conditioning program. I would anticipate maximum medical improvement in three months following the surgical procedure. In the interim, she is to continue no lifting over five pounds, no overhead work. Continue with her current analgesics. If she has any residual questions, concerning that she is free to call. I do feel this is related to her initial injury which is again over almost 14 months from that and got the insurance.  Plan left shoulder arthroscopy, SAD, mini-open RCR  Carri Spillers M. PA-C for Dr. Shelle Iron 08/08/2014, 6:01 PM

## 2014-08-15 NOTE — Anesthesia Procedure Notes (Signed)
Procedure Name: Intubation Date/Time: 08/15/2014 7:33 AM Performed by: Paris Lore Pre-anesthesia Checklist: Patient identified, Emergency Drugs available, Suction available, Patient being monitored and Timeout performed Patient Re-evaluated:Patient Re-evaluated prior to inductionOxygen Delivery Method: Circle system utilized Preoxygenation: Pre-oxygenation with 100% oxygen Intubation Type: IV induction Ventilation: Mask ventilation without difficulty Laryngoscope Size: Mac and 4 Grade View: Grade I Tube type: Oral Tube size: 7.5 mm Number of attempts: 1 Airway Equipment and Method: Stylet Placement Confirmation: ETT inserted through vocal cords under direct vision,  positive ETCO2,  CO2 detector and breath sounds checked- equal and bilateral Secured at: 22 cm Tube secured with: Tape Dental Injury: Teeth and Oropharynx as per pre-operative assessment

## 2014-08-15 NOTE — Anesthesia Postprocedure Evaluation (Signed)
  Anesthesia Post-op Note  Patient: Carol Prince  Procedure(s) Performed: Procedure(s) (LRB): LEFT SHOULDER ARTHROSCOPY WITH SUBACROMIAL DECOMPRESSION AND MINI OPEN ROTATOR CUFF REPAIR (Left)  Patient Location: PACU  Anesthesia Type: General  Level of Consciousness: awake and alert   Airway and Oxygen Therapy: Patient Spontanous Breathing  Post-op Pain: mild  Post-op Assessment: Post-op Vital signs reviewed, Patient's Cardiovascular Status Stable, Respiratory Function Stable, Patent Airway and No signs of Nausea or vomiting  Last Vitals:  Filed Vitals:   08/15/14 1030  BP: 128/63  Pulse: 87  Temp:   Resp: 19    Post-op Vital Signs: stable   Complications: No apparent anesthesia complications

## 2014-08-15 NOTE — Interval H&P Note (Signed)
History and Physical Interval Note:  08/15/2014 7:20 AM  Carol Prince  has presented today for surgery, with the diagnosis of left rotator cuff tear  The various methods of treatment have been discussed with the patient and family. After consideration of risks, benefits and other options for treatment, the patient has consented to  Procedure(s): LEFT SHOULDER ARTHROSCOPY WITH SUBACROMIAL DECOMPRESSION AND MINI OPEN ROTATOR CUFF REPAIR (Left) as a surgical intervention .  The patient's history has been reviewed, patient examined, no change in status, stable for surgery.  I have reviewed the patient's chart and labs.  Questions were answered to the patient's satisfaction.     Shamira Toutant C

## 2014-08-15 NOTE — Transfer of Care (Signed)
Immediate Anesthesia Transfer of Care Note  Patient: Carol Prince  Procedure(s) Performed: Procedure(s): LEFT SHOULDER ARTHROSCOPY WITH SUBACROMIAL DECOMPRESSION AND MINI OPEN ROTATOR CUFF REPAIR (Left)  Patient Location: PACU  Anesthesia Type:General  Level of Consciousness:  sedated, patient cooperative and responds to stimulation  Airway & Oxygen Therapy:Patient Spontanous Breathing and Patient connected to face mask oxgen  Post-op Assessment:  Report given to PACU RN and Post -op Vital signs reviewed and stable  Post vital signs:  Reviewed and stable  Last Vitals:  Filed Vitals:   08/15/14 0533  BP: 111/71  Pulse: 84  Temp: 36.8 C  Resp: 84    Complications: No apparent anesthesia complications

## 2014-08-15 NOTE — Anesthesia Preprocedure Evaluation (Addendum)
Anesthesia Evaluation  Patient identified by MRN, date of birth, ID band Patient awake    Reviewed: Allergy & Precautions, H&P , NPO status , Patient's Chart, lab work & pertinent test results  History of Anesthesia Complications Negative for: history of anesthetic complications  Airway Mallampati: I  TM Distance: >3 FB Neck ROM: full    Dental no notable dental hx.    Pulmonary neg pulmonary ROS,  breath sounds clear to auscultation  Pulmonary exam normal       Cardiovascular negative cardio ROS Normal cardiovascular examRhythm:regular Rate:Normal     Neuro/Psych Seizures -, Well Controlled,     GI/Hepatic Neg liver ROS, GERD-  ,  Endo/Other  negative endocrine ROS  Renal/GU negative Renal ROS     Musculoskeletal   Abdominal (+) + obese,   Peds  Hematology negative hematology ROS (+)   Anesthesia Other Findings Takes carbamazepine for seizures  Reproductive/Obstetrics negative OB ROS                            Anesthesia Physical Anesthesia Plan  ASA: II  Anesthesia Plan: General   Post-op Pain Management:    Induction: Intravenous  Airway Management Planned: Oral ETT  Additional Equipment:   Intra-op Plan:   Post-operative Plan:   Informed Consent: I have reviewed the patients History and Physical, chart, labs and discussed the procedure including the risks, benefits and alternatives for the proposed anesthesia with the patient or authorized representative who has indicated his/her understanding and acceptance.     Plan Discussed with: Anesthesiologist, CRNA and Surgeon  Anesthesia Plan Comments: (Discussed possibility of post op interscalene block if needed for pain, patient agrees to risks)       Anesthesia Quick Evaluation

## 2014-08-16 ENCOUNTER — Encounter (HOSPITAL_COMMUNITY): Payer: Self-pay | Admitting: Specialist

## 2014-08-16 DIAGNOSIS — M7542 Impingement syndrome of left shoulder: Secondary | ICD-10-CM | POA: Diagnosis not present

## 2014-08-16 LAB — CBC
HCT: 37.2 % (ref 36.0–46.0)
HEMOGLOBIN: 12.2 g/dL (ref 12.0–15.0)
MCH: 27.5 pg (ref 26.0–34.0)
MCHC: 32.8 g/dL (ref 30.0–36.0)
MCV: 83.8 fL (ref 78.0–100.0)
Platelets: 208 10*3/uL (ref 150–400)
RBC: 4.44 MIL/uL (ref 3.87–5.11)
RDW: 13.8 % (ref 11.5–15.5)
WBC: 9.2 10*3/uL (ref 4.0–10.5)

## 2014-08-16 LAB — BASIC METABOLIC PANEL
Anion gap: 7 (ref 5–15)
BUN: 8 mg/dL (ref 6–20)
CALCIUM: 8.6 mg/dL — AB (ref 8.9–10.3)
CO2: 24 mmol/L (ref 22–32)
Chloride: 107 mmol/L (ref 101–111)
Creatinine, Ser: 0.7 mg/dL (ref 0.44–1.00)
GFR calc Af Amer: >60 mL/min (ref >60)
GFR calc non Af Amer: >60 mL/min (ref >60)
GLUCOSE: 139 mg/dL — AB (ref 65–99)
Potassium: 3.7 mmol/L (ref 3.5–5.1)
SODIUM: 138 mmol/L (ref 135–145)

## 2014-08-16 NOTE — Progress Notes (Signed)
RN reviewed discharge instructions with patient and family. All questions answered.   Paperwork and prescriptions given.   NT rolled patient down in wheelchair to family car with all belongings.     

## 2014-08-16 NOTE — Progress Notes (Signed)
PT Cancellation Note  Patient Details Name: Carol Prince MRN: [id number] DOB: [birth year]   Cancelled Treatment:    Reason Eval/Treat Not Completed: PT screened, no needs identified, will sign off  Per OT, no PT needs at this time.  PT to sign off.   Vilda Zollner,KATHrine E 08/16/2014, 10:00 AM Zenovia Jarred, PT, DPT 08/16/2014 Pager: [phone]

## 2014-08-16 NOTE — Progress Notes (Signed)
   08/16/14 1200  OT Visit Information  Last OT Received On 08/16/14  Assistance Needed +1  History of Present Illness s/p L RCR  OT Time Calculation  OT Start Time (ACUTE ONLY) 1138  OT Stop Time (ACUTE ONLY) 1149  OT Time Calculation (min) 11 min  Precautions  Precautions Shoulder  Type of Shoulder Precautions sling off for bathing/dressing and pendulums  Precaution Booklet Issued Yes (comment)  Pain Assessment  Pain Score 4  Pain Location R shoulder  Pain Descriptors / Indicators Aching  Pain Intervention(s) Limited activity within patient's tolerance;Monitored during session;Repositioned;RN gave pain meds during session  Cognition  Arousal/Alertness Awake/alert  Behavior During Therapy [facility] for tasks assessed/performed  Overall Cognitive Status Within Functional Limits for tasks assessed  ADL  General ADL Comments mother present:  reviewed shoulder protocol and demonstrated passive pendulums.  Mother donned sling with cues, min A to pull up and adjust trough.  She verbalizes understanding.  Pt did not feel that large sling had enough support:  switched for medium immobilizer.  Mother verbalizes all education.  Did not work through pendulums again.  Although pt verbalizes same level of pain; she appears to be in more based on the faces scale  Restrictions  Other Position/Activity Restrictions NWB  OT - End of Session  Activity Tolerance Patient tolerated treatment well  Patient left in chair;with call bell/phone within reach;with family/visitor present  OT Assessment/Plan  Follow Up Recommendations (pt will follow up with Dr Shelle Iron)  OT Equipment None recommended by OT  OT Goal Progression  Progress towards OT goals Progressing toward goals (mother verbalizes sling:  min A given)  OT G-codes **NOT FOR INPATIENT CLASS**  Self Care Discharge Status (Z6109) CK  OT General Charges  $OT Visit 1 Procedure  OT Treatments  $Self Care/Home Management  8-22 mins  Marica Otter,  OTR/L [phone] 08/16/2014

## 2014-08-16 NOTE — Op Note (Signed)
[name], Prince NO.:  [id number]  MEDICAL RECORD NO.:  [id number]  LOCATION:  1614                         FACILITY:  [facility]  PHYSICIAN:  Jene Every, M.D.    DATE OF BIRTH:  1970-09-18  DATE OF PROCEDURE:  08/15/2014 DATE OF DISCHARGE:                              OPERATIVE REPORT   PREOPERATIVE DIAGNOSES:  Rotator cuff tear, left shoulder, impingement syndrome.  POSTOPERATIVE DIAGNOSES:  Rotator cuff tear, left shoulder, impingement syndrome.  PROCEDURE PERFORMED: 1. Left shoulder arthroscopy with subacromial decompression. 2. Mini open rotator cuff repair with subacromial decompression.  ANESTHESIA:  General.  ASSISTANT:  Skip Mayer, P.A.  HISTORY:  A 44 year old with refractory pain secondary to rotator cuff tear, progressive, indicated for repair, it was in the supraspinatus over 50%.  Risks and benefits were discussed including bleeding, infection, suboptimal range of motion, re-tear, etc.  TECHNIQUE:  With the patient in a supine beach-chair position, after the induction of adequate anesthesia and 2 g of Kefzol, left shoulder and upper extremity was prepped and draped in usual sterile fashion.  Exam revealed full range.  Surgical marker was utilized to delineate the acromion, AC joint, and coracoid.  Standard posterolateral and anterolateral portals were utilized with incision through the skin only with a #11 blade.  With the arm in the 70/30 position, we advanced the arthroscopic camera in the glenohumeral joint penetrating it atraumatically.  Irrigant was utilized to insufflate the joint 65 mmHg. Inspection revealed a tear of the supraspinatus just lateral to the bicipital groove.  Biceps tendon was unremarkable as was the glenoid labrum, glenoid subscap, and humerus.  Lavaged the joint, we redirected the camera in the subacromial space triangulating with the anterolateral portal.  Hypertrophic and exuberant bursa was noted and tear  was noted in the supraspinatus.  We performed a bursectomy, partially released CA ligament.  We then removed all arthroscopic equipment and proceeded the mini open rotator cuff repair.  We closed the portals with 4-0 nylon simple suture.  A 2-cm incision over the anterolateral aspect of the acromion was made into the skin after infiltration with 0.25% Marcaine. Subcutaneous tissue was dissected.  Electrocautery was utilized to achieve hemostasis.  The raphe between the anterolateral heads was identified and divided in line of the skin incision no longer than 2 cm on the lateral aspect of the acromion.  A deep Chung retractor was then placed.  Residual hypertrophic bursa was excised using 3 mm Kerrison to remove a small spur of the anterior lateral aspect of the acromion.  The tear in the supraspinatus lateral to the bicipital groove was noted.  I made a longitudinal incision approximately 2 cm, debrided the cuff, performed a trough lateral to the articular surface and on the greater tuberosity.  I then placed an awl into the greater tuberosity for a suture anchor, SwiveLock.  I inserted the SwiveLock; however, SwiveLock did not flush with the greater tuberosity, we were unable to advance it, therefore had to remove it.  We then used a second one and used a preliminary awl and impacted.  A portion of the first PEEK suture anchor was retained in the bone.  We did not feel  that removing it was required and then advanced in with the second one, repositioned the awl and impacted into place.  We inserted the second SwiveLock with excellent purchase, seated below flush.  Two sutures FiberWire emanating from that were threaded through the rotator cuff anteriorly and posteriorly with the scorpion and then we pulled the rotator cuff side-to-side into that cancellous bed with a good surgical knot advancing that using the knot pusher.  Following that, then we used the 4 leaflets of the  suture, brought them over the greater tuberosity and put them in PushLock to allow the knots to lie down flushed.  I used an awl and then inserted the PushLock with excellent purchase.  I inspected the cuff, there was excellent repair.  Remainder of the cuff was unremarkable.  Copiously irrigated the wound, then we repaired the raphe with 1 Vicryl, subcu with 2, and skin with subcuticular Prolene.  Sterile dressing applied. Placed in a sling, immobilizer, extubated without difficulty, and transported to the recovery room in satisfactory condition.  The patient tolerated the procedure well.  No complications.  Assistant, Skip Mayer, PA.  Minimal blood loss.     Jene Every, M.D.     Cordelia Pen  D:  08/15/2014  T:  08/16/2014  Job:  [id number]

## 2014-08-16 NOTE — Progress Notes (Signed)
Subjective: 1 Day Post-Op Procedure(s) (LRB): LEFT SHOULDER ARTHROSCOPY WITH SUBACROMIAL DECOMPRESSION AND MINI OPEN ROTATOR CUFF REPAIR (Left) Patient reports pain as 2 on 0-10 scale.    Objective: Vital signs in last 24 hours: Temp:  [97.4 F (36.3 C)-98.5 F (36.9 C)] 98.4 F (36.9 C) (08/12 0525) Pulse Rate:  [69-87] 69 (08/12 0525) Resp:  [16-20] 16 (08/12 0525) BP: (102-128)/(61-75) 120/69 mmHg (08/12 0525) SpO2:  [98 %-100 %] 100 % (08/12 0525)  Intake/Output from previous day: 08/11 0701 - 08/12 0700 In: 2140 [P.O.:840; I.V.:1300] Out: 800 [Urine:800] Intake/Output this shift:     Recent Labs  08/13/14 0830 08/15/14 1240 08/16/14 0500  HGB 13.1 12.6 12.2    Recent Labs  08/15/14 1240 08/16/14 0500  WBC 6.1 9.2  RBC 4.43 4.44  HCT 37.3 37.2  PLT 218 208    Recent Labs  08/16/14 0500  NA 138  K 3.7  CL 107  CO2 24  BUN 8  CREATININE 0.70  GLUCOSE 139*  CALCIUM 8.6*   No results for input(s): LABPT, INR in the last 72 hours.  Neurologically intact Dorsiflexion/Plantar flexion intact Incision: dressing C/D/I  Assessment/Plan: 1 Day Post-Op Procedure(s) (LRB): LEFT SHOULDER ARTHROSCOPY WITH SUBACROMIAL DECOMPRESSION AND MINI OPEN ROTATOR CUFF REPAIR (Left) Advance diet Up with therapy D/C IV fluids Discharge home with home health  F/U 2 weeks  Discussed with patient  Tashia Leiterman C 08/16/2014, 7:10 AM

## 2014-08-16 NOTE — Evaluation (Signed)
Occupational Therapy Evaluation Patient Details Name: Carol Prince MRN: [id number] DOB: December 08, 1970 Today's Date: 08/16/2014    History of Present Illness s/p L RCR   Clinical Impression   This 44 year old female was admitted for the above surgery. Will follow in acute for family education.  Pt will follow up with Dr Shelle Iron for further rehab    Follow Up Recommendations   (pt will follow up with Dr Shelle Iron)    Equipment Recommendations  None recommended by OT    Recommendations for Other Services       Precautions / Restrictions Precautions Precautions: Shoulder Type of Shoulder Precautions: sling off for bathing/dressing and pendulums Precaution Booklet Issued: Yes (comment) Restrictions Weight Bearing Restrictions: Yes Other Position/Activity Restrictions: NWB      Mobility Bed Mobility Overal bed mobility: Modified Independent             General bed mobility comments: HOB raised  Transfers Overall transfer level: Independent                    Balance Overall balance assessment: No apparent balance deficits (not formally assessed) (unable to plantar flex R foot)                                          ADL Overall ADL's : Needs assistance/impaired     Grooming: Wash/dry hands;Supervision/safety;Sitting   Upper Body Bathing: Moderate assistance;Sitting   Lower Body Bathing: Moderate assistance;Sit to/from stand   Upper Body Dressing : Moderate assistance;Sitting   Lower Body Dressing: Moderate assistance;Sit to/from stand   Toilet Transfer: Min guard;Ambulation   Toileting- Clothing Manipulation and Hygiene: Supervision/safety;Sit to/from stand         General ADL Comments: pt unable to plantar flex R foot for adls  Worked through ADL and pendulums.  Handout given. See education section of chart     Vision     Perception     Praxis      Pertinent Vitals/Pain Pain Assessment: 0-10 Pain Score: 4  Pain  Location: R shoulder Pain Descriptors / Indicators: Aching Pain Intervention(s): Limited activity within patient's tolerance;Monitored during session;Premedicated before session;Repositioned (ice just removed)     Hand Dominance Right (but uses L a lot also)   Extremity/Trunk Assessment Upper Extremity Assessment Upper Extremity Assessment: LUE deficits/detail LUE Deficits / Details: immobilized           Communication Communication Communication: No difficulties   Cognition Arousal/Alertness: Awake/alert Behavior During Therapy: WFL for tasks assessed/performed Overall Cognitive Status: Within Functional Limits for tasks assessed                     General Comments       Exercises       Shoulder Instructions      Home Living Family/patient expects to be discharged to:: Private residence Living Arrangements: Parent                 Bathroom Shower/Tub: Tub/shower unit Shower/tub characteristics: Engineer, building services: Standard         Additional Comments: mother has walk in shower.  Pt has 17 year old       Prior Functioning/Environment Level of Independence: Independent             OT Diagnosis: Acute pain   OT Problem List: Pain;Decreased knowledge of precautions   OT Treatment/Interventions:  Self-care/ADL training;DME and/or AE instruction;Patient/family education;Therapeutic exercise    OT Goals(Current goals can be found in the care plan section) Acute Rehab OT Goals Patient Stated Goal: get healed; pt works with preschoolers OT Goal Formulation: With patient Time For Goal Achievement: 08/23/14 Potential to Achieve Goals: Good ADL Goals Additional ADL Goal #1: cargeiver will don sling with supervision and verbalize understanding of assisting pt following all shoulder education for ADLs  OT Frequency: Min 2X/week   Barriers to D/C:            Co-evaluation              End of Session    Activity Tolerance: Patient  tolerated treatment well;No increased pain Patient left: in chair;with call bell/phone within reach   Time: 0732-0812 OT Time Calculation (min): 40 min Charges:  OT General Charges $OT Visit: 1 Procedure OT Evaluation $Initial OT Evaluation Tier I: 1 Procedure OT Treatments $Self Care/Home Management : 8-22 mins $Therapeutic Activity: 8-22 mins G-Codes: OT G-codes **NOT FOR INPATIENT CLASS** Functional Assessment Tool Used: clinical observation Functional Limitation: Self care Self Care Current Status (W0981): At least 40 percent but less than 60 percent impaired, limited or restricted Self Care Goal Status (X9147): At least 40 percent but less than 60 percent impaired, limited or restricted  [facility] 08/16/2014, 8:24 AM   Marica Otter, OTR/L [phone] 08/16/2014

## 2014-08-22 NOTE — Discharge Summary (Signed)
Patient ID: Carol Prince MRN: [id number] DOB/AGE: February 12, 1970 44 y.o.  Admit date: 08/15/2014 Discharge date: 08/22/2014  Admission Diagnoses:  Active Problems:   Rotator cuff tear   Discharge Diagnoses:  Same  Past Medical History  Diagnosis Date  . GERD (gastroesophageal reflux disease)   . Seizures     last seizure- 2015     Surgeries: Procedure(s): LEFT SHOULDER ARTHROSCOPY WITH SUBACROMIAL DECOMPRESSION AND MINI OPEN ROTATOR CUFF REPAIR on 08/15/2014   Consultants:    Discharged Condition: Improved  Hospital Course: Carol Prince is an 44 y.o. female who was admitted 08/15/2014 for operative treatment of rotator cuff tear. Patient has severe unremitting pain that affects sleep, daily activities, and work/hobbies. After pre-op clearance the patient was taken to the operating room on 08/15/2014 and underwent  Procedure(s): LEFT SHOULDER ARTHROSCOPY WITH SUBACROMIAL DECOMPRESSION AND MINI OPEN ROTATOR CUFF REPAIR.    Patient was given perioperative antibiotics:  Anti-infectives    Start     Dose/Rate Route Frequency Ordered Stop   08/15/14 1200  clindamycin (CLEOCIN) IVPB 900 mg     900 mg 100 mL/hr over 30 Minutes Intravenous Every 6 hours 08/15/14 1118 08/15/14 1825   08/15/14 0600  clindamycin (CLEOCIN) IVPB 900 mg     900 mg 100 mL/hr over 30 Minutes Intravenous On call to O.R. 08/15/14 0547 08/15/14 0750       Patient was given sequential compression devices, early ambulation, and chemoprophylaxis to prevent DVT.  Patient benefited maximally from hospital stay and there were no complications.    Recent vital signs: No data found.    Recent laboratory studies: No results for input(s): WBC, HGB, HCT, PLT, NA, K, CL, CO2, BUN, CREATININE, GLUCOSE, INR, CALCIUM in the last 72 hours.  Invalid input(s): PT, 2   Discharge Medications:     Medication List    STOP taking these medications        methylPREDNISolone 4 MG tablet  Commonly known as:  MEDROL  DOSEPAK      TAKE these medications        carbamazepine 200 MG tablet  Commonly known as:  TEGRETOL  Take 200 mg by mouth 2 (two) times daily between meals.     cyclobenzaprine 10 MG tablet  Commonly known as:  FLEXERIL  Take 10 mg by mouth at bedtime as needed for muscle spasms.     docusate sodium 100 MG capsule  Commonly known as:  COLACE  Take 1 capsule (100 mg total) by mouth 2 (two) times daily as needed for mild constipation.     ibuprofen 800 MG tablet  Commonly known as:  ADVIL,MOTRIN  Take 1 tablet (800 mg total) by mouth 3 (three) times daily.     methocarbamol 500 MG tablet  Commonly known as:  ROBAXIN  Take 1 tablet (500 mg total) by mouth 3 (three) times daily.     oxyCODONE-acetaminophen 5-325 MG per tablet  Commonly known as:  PERCOCET  Take 1 tablet by mouth every 4 (four) hours as needed.        Diagnostic Studies: No results found.  Disposition: 01-Home or Self Care      Discharge Instructions    Call MD / Call 911    Complete by:  As directed   If you experience chest pain or shortness of breath, CALL 911 and be transported to the hospital emergency room.  If you develope a fever above 101 F, pus (white drainage) or increased drainage or redness at the wound,  or calf pain, call your surgeon's office.     Constipation Prevention    Complete by:  As directed   Drink plenty of fluids.  Prune juice may be helpful.  You may use a stool softener, such as Colace (over the counter) 100 mg twice a day.  Use MiraLax (over the counter) for constipation as needed.     Diet - low sodium heart healthy    Complete by:  As directed      Increase activity slowly as tolerated    Complete by:  As directed               Signed: Darvin Dials C 08/22/2014, 10:46 AM

## 2015-06-16 ENCOUNTER — Ambulatory Visit (HOSPITAL_COMMUNITY)
Admission: EM | Admit: 2015-06-16 | Discharge: 2015-06-16 | Disposition: A | Discharge status: Home / Self Care | Payer: BLUE CROSS/BLUE SHIELD | Attending: Family Medicine | Admitting: Family Medicine

## 2015-06-16 ENCOUNTER — Encounter (HOSPITAL_COMMUNITY): Payer: Self-pay | Admitting: Emergency Medicine

## 2015-06-16 ENCOUNTER — Ambulatory Visit (INDEPENDENT_AMBULATORY_CARE_PROVIDER_SITE_OTHER): Payer: BLUE CROSS/BLUE SHIELD

## 2015-06-16 DIAGNOSIS — J029 Acute pharyngitis, unspecified: Secondary | ICD-10-CM | POA: Diagnosis not present

## 2015-06-16 DIAGNOSIS — R569 Unspecified convulsions: Secondary | ICD-10-CM | POA: Diagnosis not present

## 2015-06-16 DIAGNOSIS — Z79899 Other long term (current) drug therapy: Secondary | ICD-10-CM | POA: Insufficient documentation

## 2015-06-16 DIAGNOSIS — Z880 Allergy status to penicillin: Secondary | ICD-10-CM | POA: Insufficient documentation

## 2015-06-16 DIAGNOSIS — R079 Chest pain, unspecified: Secondary | ICD-10-CM | POA: Diagnosis not present

## 2015-06-16 DIAGNOSIS — R05 Cough: Secondary | ICD-10-CM | POA: Diagnosis not present

## 2015-06-16 DIAGNOSIS — J302 Other seasonal allergic rhinitis: Secondary | ICD-10-CM | POA: Insufficient documentation

## 2015-06-16 LAB — POCT RAPID STREP A: STREPTOCOCCUS, GROUP A SCREEN (DIRECT): NEGATIVE

## 2015-06-16 MED ORDER — IPRATROPIUM BROMIDE 0.06 % NA SOLN: 2.0000 | Freq: Four times a day (QID) | NASAL | Status: DC

## 2015-06-16 MED ORDER — GUAIFENESIN-CODEINE 100-10 MG/5ML PO SYRP: 10.0000 mL | ORAL_SOLUTION | Freq: Four times a day (QID) | ORAL | Status: DC | PRN

## 2015-06-16 MED ORDER — AZITHROMYCIN 250 MG PO TABS: ORAL_TABLET | ORAL | Status: DC

## 2015-06-16 NOTE — ED Notes (Signed)
[facility] a [facility] of [facility] cough and sore throat for 11 days.

## 2015-06-16 NOTE — ED Provider Notes (Signed)
CSN: [id number]     Arrival date & time 06/16/15  1539 History   First MD Initiated Contact with Patient 06/16/15 1749     Chief Complaint  Patient presents with  . Cough  . Sore Throat   (Consider location/radiation/quality/duration/timing/severity/associated sxs/prior Treatment) Patient is a 45 y.o. female presenting with cough. The history is provided by the patient.  Cough Cough characteristics:  Dry and non-productive Severity:  Moderate Onset quality:  Gradual Duration:  11 days Progression:  Unchanged Chronicity:  New Smoker: no   Context: sick contacts   Relieved by:  None tried Worsened by:  Nothing tried Ineffective treatments:  None tried Associated symptoms: no shortness of breath and no wheezing     Past Medical History  Diagnosis Date  . GERD (gastroesophageal reflux disease)   . Seizures (HCC)     last seizure- 2015    Past Surgical History  Procedure Laterality Date  . Abdominal hysterectomy    . Shoulder arthroscopy with subacromial decompression and open rotator c Left 08/15/2014    Procedure: LEFT SHOULDER ARTHROSCOPY WITH SUBACROMIAL DECOMPRESSION AND MINI OPEN ROTATOR CUFF REPAIR;  Surgeon: Jene EveryJeffrey Beane, MD;  Location: [facility];  Service: Orthopedics;  Laterality: Left;  With ANCHORS   History reviewed. No pertinent family history. Social History  Substance Use Topics  . Smoking status: Never Smoker   . Smokeless tobacco: Never Used  . Alcohol Use: No   OB History    No data available     Review of Systems  Constitutional: Negative.   HENT: Positive for congestion and postnasal drip.   Respiratory: Positive for cough. Negative for shortness of breath and wheezing.   Cardiovascular: Negative.   Genitourinary: Negative.   All other systems reviewed and are negative.   Allergies  Ampicillin  Home Medications   Prior to Admission medications   Medication Sig Start Date End Date Taking? Authorizing Provider  carbamazepine (TEGRETOL) 200 MG  tablet Take 200 mg by mouth 2 (two) times daily between meals.    Yes Historical Provider, MD  azithromycin (ZITHROMAX Z-PAK) 250 MG tablet Take as directed on pack 06/16/15   Linna HoffJames D Jagger Demonte, MD  cyclobenzaprine (FLEXERIL) 10 MG tablet Take 10 mg by mouth at bedtime as needed for muscle spasms.    Historical Provider, MD  docusate sodium (COLACE) 100 MG capsule Take 1 capsule (100 mg total) by mouth 2 (two) times daily as needed for mild constipation. 08/15/14   Jene EveryJeffrey Beane, MD  guaiFENesin-codeine [facility](ROBITUSSIN AC) 100-10 MG/5ML syrup Take 10 mLs by mouth 4 (four) times daily as needed for cough. 06/16/15   Linna HoffJames D Justus Duerr, MD  ibuprofen (ADVIL,MOTRIN) 800 MG tablet Take 1 tablet (800 mg total) by mouth 3 (three) times daily. Patient not taking: Reported on 08/13/2014 01/11/14   Richardean Canalavid H Yao, MD  ipratropium (ATROVENT) 0.06 % nasal spray Place 2 sprays into both nostrils 4 (four) times daily. 06/16/15   Linna HoffJames D Khing Belcher, MD  methocarbamol (ROBAXIN) 500 MG tablet Take 1 tablet (500 mg total) by mouth 3 (three) times daily. 08/15/14   Jene EveryJeffrey Beane, MD  oxyCODONE-acetaminophen (PERCOCET) 5-325 MG per tablet Take 1 tablet by mouth every 4 (four) hours as needed. 08/15/14   Jene EveryJeffrey Beane, MD   Meds Ordered and Administered this Visit  Medications - No data to display  BP 114/57 mmHg  Pulse 81  Temp(Src) 98.6 F (37 C) (Oral)  Resp 16  SpO2 100% No data found.   Physical Exam  Constitutional:  She is oriented to person, place, and time. She appears well-developed and well-nourished. No distress.  HENT:  Right Ear: External ear normal.  Left Ear: External ear normal.  Mouth/Throat: Oropharynx is clear and moist.  Neck: Normal range of motion. Neck supple.  Cardiovascular: Normal rate, normal heart sounds and intact distal pulses.   Pulmonary/Chest: Effort normal and breath sounds normal.  Abdominal: She exhibits no mass. There is no tenderness.  Lymphadenopathy:    She has no cervical adenopathy.   Neurological: She is alert and oriented to person, place, and time.  Skin: Skin is warm and dry.  Nursing note and vitals reviewed.   ED Course  Procedures (including critical care time)  Labs Review Labs Reviewed  CULTURE, GROUP A STREP [facility])  POCT RAPID STREP A    Imaging Review Dg Chest 2 View  06/16/2015  CLINICAL DATA:  Cough and chest pain EXAM: CHEST  2 VIEW COMPARISON:  None. FINDINGS: The lungs are clear wiithout focal pneumonia, edema, pneumothorax or pleural effusion. The cardiopericardial silhouette is within normal limits for size. The visualized bony structures of the thorax are intact. IMPRESSION: No active cardiopulmonary disease. Electronically Signed   By: Kennith Center M.D.   On: 06/16/2015 18:20     Visual Acuity Review  Right Eye Distance:   Left Eye Distance:   Bilateral Distance:    Right Eye Near:   Left Eye Near:    Bilateral Near:         MDM   1. Seasonal allergic rhinitis        Linna Hoff, MD 06/16/15 1850

## 2015-06-19 LAB — CULTURE, GROUP A STREP (THRC)

## 2015-07-01 ENCOUNTER — Other Ambulatory Visit: Payer: Self-pay | Admitting: Internal Medicine

## 2015-07-01 DIAGNOSIS — Z1231 Encounter for screening mammogram for malignant neoplasm of breast: Secondary | ICD-10-CM

## 2015-07-16 ENCOUNTER — Ambulatory Visit
Admission: RE | Admit: 2015-07-16 | Discharge: 2015-07-16 | Disposition: A | Discharge status: Home / Self Care | Payer: BLUE CROSS/BLUE SHIELD | Source: Ambulatory Visit | Attending: Internal Medicine | Admitting: Internal Medicine

## 2015-07-16 DIAGNOSIS — Z1231 Encounter for screening mammogram for malignant neoplasm of breast: Secondary | ICD-10-CM | POA: Diagnosis not present

## 2015-09-25 ENCOUNTER — Ambulatory Visit (HOSPITAL_COMMUNITY)
Admission: EM | Admit: 2015-09-25 | Discharge: 2015-09-25 | Disposition: A | Discharge status: Home / Self Care | Payer: BLUE CROSS/BLUE SHIELD | Attending: Internal Medicine | Admitting: Internal Medicine

## 2015-09-25 ENCOUNTER — Encounter (HOSPITAL_COMMUNITY): Payer: Self-pay | Admitting: Emergency Medicine

## 2015-09-25 DIAGNOSIS — L989 Disorder of the skin and subcutaneous tissue, unspecified: Secondary | ICD-10-CM

## 2015-09-25 DIAGNOSIS — L918 Other hypertrophic disorders of the skin: Secondary | ICD-10-CM | POA: Diagnosis not present

## 2015-09-25 NOTE — ED Triage Notes (Signed)
Pt here for abscess on left axilla onset 3 weeks and left buttocks onset 1 week  Sx include pain and swelling   Denies fevers, chills, drainage  A&O x4... NAD

## 2015-09-25 NOTE — ED Provider Notes (Signed)
CSN: [id number]     Arrival date & time 09/25/15  1729 History   First MD Initiated Contact with Patient 09/25/15 1832     Chief Complaint  Patient presents with  . Abscess   (Consider location/radiation/quality/duration/timing/severity/associated sxs/prior Treatment) 45 year old female concerned about 2 skin lesions. One of the left axilla for 3 weeks and one to the right posterior proximal thigh for 1-2 weeks. Both of these tend to be getting smaller and resolving and are mildly tender.      Past Medical History:  Diagnosis Date  . GERD (gastroesophageal reflux disease)   . Seizures (HCC)    last seizure- 2015    Past Surgical History:  Procedure Laterality Date  . ABDOMINAL HYSTERECTOMY    . SHOULDER ARTHROSCOPY WITH SUBACROMIAL DECOMPRESSION AND OPEN ROTATOR C Left 08/15/2014   Procedure: LEFT SHOULDER ARTHROSCOPY WITH SUBACROMIAL DECOMPRESSION AND MINI OPEN ROTATOR CUFF REPAIR;  Surgeon: Jene Every, MD;  Location: [facility];  Service: Orthopedics;  Laterality: Left;  With ANCHORS   History reviewed. No pertinent family history. Social History  Substance Use Topics  . Smoking status: Never Smoker  . Smokeless tobacco: Never Used  . Alcohol use No   OB History    No data available     Review of Systems  Constitutional: Negative.   Skin:       2 lesions as described in history of present illness. No rash.  All other systems reviewed and are negative.   Allergies  Ampicillin  Home Medications   Prior to Admission medications   Medication Sig Start Date End Date Taking? Authorizing Provider  azithromycin (ZITHROMAX Z-PAK) 250 MG tablet Take as directed on pack 06/16/15   Linna Hoff, MD  carbamazepine (TEGRETOL) 200 MG tablet Take 200 mg by mouth 2 (two) times daily between meals.     Historical Provider, MD  cyclobenzaprine (FLEXERIL) 10 MG tablet Take 10 mg by mouth at bedtime as needed for muscle spasms.    Historical Provider, MD  docusate sodium (COLACE) 100  MG capsule Take 1 capsule (100 mg total) by mouth 2 (two) times daily as needed for mild constipation. 08/15/14   Jene Every, MD  guaiFENesin-codeine [facility]) 100-10 MG/5ML syrup Take 10 mLs by mouth 4 (four) times daily as needed for cough. 06/16/15   Linna Hoff, MD  ibuprofen (ADVIL,MOTRIN) 800 MG tablet Take 1 tablet (800 mg total) by mouth 3 (three) times daily. Patient not taking: Reported on 09/25/2015 01/11/14   Charlynne Pander, MD  ipratropium (ATROVENT) 0.06 % nasal spray Place 2 sprays into both nostrils 4 (four) times daily. 06/16/15   Linna Hoff, MD  methocarbamol (ROBAXIN) 500 MG tablet Take 1 tablet (500 mg total) by mouth 3 (three) times daily. 08/15/14   Jene Every, MD  oxyCODONE-acetaminophen (PERCOCET) 5-325 MG per tablet Take 1 tablet by mouth every 4 (four) hours as needed. 08/15/14   Jene Every, MD   Meds Ordered and Administered this Visit  Medications - No data to display  BP 118/77 (BP Location: Left Arm)   Pulse 81   Temp 98.1 F (36.7 C) (Oral)   Resp 20   SpO2 99%  No data found.   Physical Exam  Constitutional: She is oriented to person, place, and time. She appears well-developed and well-nourished. No distress.  Eyes: EOM are normal.  Neck: Normal range of motion. Neck supple.  Cardiovascular: Normal rate.   Pulmonary/Chest: Effort normal. No respiratory distress.  Musculoskeletal: She  exhibits no edema.  Neurological: She is alert and oriented to person, place, and time. She exhibits normal muscle tone.  Skin: Skin is warm and dry.  The left axillary lesion is pink, soft and fleshy. Measures approximately 2 mm x 5 mm in size. Mildly tender. No surrounding erythema. This lesion is raised and appears as a "stuck on" lesion. No drainage or bleeding. No surrounding induration. No evidence of infection or cellulitis.  The lesion to the right posterior thigh measures approximately 1-1/2 mm in diameter. Is a small scab-like lesion and well  marginated. No surrounding erythema, induration, tenderness, signs of infection or other lesions. Minimal tenderness.  Psychiatric: She has a normal mood and affect.  Nursing note and vitals reviewed.   Urgent Care Course   Clinical Course    Procedures (including critical care time)  Labs Review Labs Reviewed - No data to display  Imaging Review No results found.   Visual Acuity Review  Right Eye Distance:   Left Eye Distance:   Bilateral Distance:    Right Eye Near:   Left Eye Near:    Bilateral Near:         MDM   1. Skin tag   2. Skin lesion of left arm    The appearance of these lesions appear to be benign. The pink lesion under the left arm appears to be somewhat irritated but no signs of infection. Cover with a Band-Aid to prevent friction injury. If it does not resolve in the next week or 2 follow-up with your primary care doctor or see a dermatologist. The lesion to the right posterior thigh looks like a resolving old skin tag which is also benign. There is no sign of infection. This should go away in a few days. For worsening may return.    Hayden Rasmussenavid Carys Malina, NP 09/25/15 1850

## 2015-09-25 NOTE — Discharge Instructions (Signed)
The appearance of these lesions appear to be benign. The pink lesion under the left arm appears to be somewhat irritated but no signs of infection. Cover with a Band-Aid to prevent friction injury. If it does not resolve in the next week or 2 follow-up with your primary care doctor or see a dermatologist. The lesion to the right posterior thigh looks like a resolving old skin tag which is also benign. There is no sign of infection. This should go away in a few days. For worsening may return.

## 2015-10-09 DIAGNOSIS — J301 Allergic rhinitis due to pollen: Secondary | ICD-10-CM | POA: Diagnosis not present

## 2015-10-09 DIAGNOSIS — G40001 Localization-related (focal) (partial) idiopathic epilepsy and epileptic syndromes with seizures of localized onset, not intractable, with status epilepticus: Secondary | ICD-10-CM | POA: Diagnosis not present

## 2015-10-09 DIAGNOSIS — Q828 Other specified congenital malformations of skin: Secondary | ICD-10-CM | POA: Diagnosis not present

## 2015-10-09 DIAGNOSIS — E6609 Other obesity due to excess calories: Secondary | ICD-10-CM | POA: Diagnosis not present

## 2015-10-09 DIAGNOSIS — Z833 Family history of diabetes mellitus: Secondary | ICD-10-CM | POA: Diagnosis not present

## 2015-10-14 DIAGNOSIS — Q828 Other specified congenital malformations of skin: Secondary | ICD-10-CM | POA: Diagnosis not present

## 2015-10-14 DIAGNOSIS — E6609 Other obesity due to excess calories: Secondary | ICD-10-CM | POA: Diagnosis not present

## 2015-10-14 DIAGNOSIS — Z6841 Body Mass Index (BMI) 40.0 and over, adult: Secondary | ICD-10-CM | POA: Diagnosis not present

## 2015-10-14 DIAGNOSIS — G40001 Localization-related (focal) (partial) idiopathic epilepsy and epileptic syndromes with seizures of localized onset, not intractable, with status epilepticus: Secondary | ICD-10-CM | POA: Diagnosis not present

## 2015-10-15 DIAGNOSIS — L732 Hidradenitis suppurativa: Secondary | ICD-10-CM | POA: Diagnosis not present

## 2015-10-30 ENCOUNTER — Encounter (HOSPITAL_BASED_OUTPATIENT_CLINIC_OR_DEPARTMENT_OTHER): Payer: Self-pay | Admitting: *Deleted

## 2015-10-30 DIAGNOSIS — Q828 Other specified congenital malformations of skin: Secondary | ICD-10-CM | POA: Diagnosis not present

## 2015-11-03 ENCOUNTER — Ambulatory Visit (HOSPITAL_BASED_OUTPATIENT_CLINIC_OR_DEPARTMENT_OTHER): Payer: BLUE CROSS/BLUE SHIELD | Admitting: Anesthesiology

## 2015-11-03 ENCOUNTER — Encounter (HOSPITAL_BASED_OUTPATIENT_CLINIC_OR_DEPARTMENT_OTHER)
Admission: RE | Disposition: A | Discharge status: Home / Self Care | Payer: Self-pay | Source: Ambulatory Visit | Attending: Specialist

## 2015-11-03 ENCOUNTER — Ambulatory Visit (HOSPITAL_BASED_OUTPATIENT_CLINIC_OR_DEPARTMENT_OTHER)
Admission: RE | Admit: 2015-11-03 | Discharge: 2015-11-03 | Disposition: A | Discharge status: Home / Self Care | Payer: BLUE CROSS/BLUE SHIELD | Source: Ambulatory Visit | Attending: Specialist | Admitting: Specialist

## 2015-11-03 ENCOUNTER — Encounter (HOSPITAL_BASED_OUTPATIENT_CLINIC_OR_DEPARTMENT_OTHER): Payer: Self-pay | Admitting: Anesthesiology

## 2015-11-03 DIAGNOSIS — E669 Obesity, unspecified: Secondary | ICD-10-CM | POA: Diagnosis not present

## 2015-11-03 DIAGNOSIS — Z79899 Other long term (current) drug therapy: Secondary | ICD-10-CM | POA: Diagnosis not present

## 2015-11-03 DIAGNOSIS — Z6837 Body mass index (BMI) 37.0-37.9, adult: Secondary | ICD-10-CM | POA: Diagnosis not present

## 2015-11-03 DIAGNOSIS — R569 Unspecified convulsions: Secondary | ICD-10-CM | POA: Insufficient documentation

## 2015-11-03 DIAGNOSIS — L732 Hidradenitis suppurativa: Secondary | ICD-10-CM | POA: Insufficient documentation

## 2015-11-03 HISTORY — PX: HYDRADENITIS EXCISION: SHX5243

## 2015-11-03 SURGERY — EXCISION, HIDRADENITIS, AXILLA
Anesthesia: General | Site: Axilla | Laterality: Left

## 2015-11-03 MED ORDER — DEXAMETHASONE SODIUM PHOSPHATE 10 MG/ML IJ SOLN
INTRAMUSCULAR | Status: AC
Start: 2015-11-03 — End: 2015-11-03
  Filled 2015-11-03: qty 1

## 2015-11-03 MED ORDER — SCOPOLAMINE 1 MG/3DAYS TD PT72
MEDICATED_PATCH | TRANSDERMAL | Status: AC
  Filled 2015-11-03: qty 1

## 2015-11-03 MED ORDER — BACITRACIN-NEOMYCIN-POLYMYXIN 400-5-5000 EX OINT
TOPICAL_OINTMENT | CUTANEOUS | Status: AC
  Filled 2015-11-03: qty 1

## 2015-11-03 MED ORDER — FENTANYL CITRATE (PF) 100 MCG/2ML IJ SOLN
INTRAMUSCULAR | Status: AC
  Filled 2015-11-03: qty 2

## 2015-11-03 MED ORDER — MIDAZOLAM HCL 2 MG/2ML IJ SOLN: 1.0000 mg | INTRAMUSCULAR | Status: DC | PRN

## 2015-11-03 MED ORDER — HYDROCODONE-ACETAMINOPHEN 5-325 MG PO TABS
ORAL_TABLET | ORAL | Status: AC
  Filled 2015-11-03: qty 1

## 2015-11-03 MED ORDER — FENTANYL CITRATE (PF) 100 MCG/2ML IJ SOLN
INTRAMUSCULAR | Status: DC | PRN
Start: 2015-11-03 — End: 2015-11-03
  Administered 2015-11-03: 100 ug via INTRAVENOUS

## 2015-11-03 MED ORDER — PROPOFOL 10 MG/ML IV BOLUS
INTRAVENOUS | Status: AC
  Filled 2015-11-03: qty 20

## 2015-11-03 MED ORDER — LIDOCAINE-EPINEPHRINE 1 %-1:100000 IJ SOLN
INTRAMUSCULAR | Status: DC | PRN
  Administered 2015-11-03: 20 mL

## 2015-11-03 MED ORDER — SCOPOLAMINE 1 MG/3DAYS TD PT72: 1.0000 | MEDICATED_PATCH | TRANSDERMAL | Status: DC

## 2015-11-03 MED ORDER — MIDAZOLAM HCL 2 MG/2ML IJ SOLN
INTRAMUSCULAR | Status: AC
  Filled 2015-11-03: qty 2

## 2015-11-03 MED ORDER — LACTATED RINGERS IV SOLN
INTRAVENOUS | Status: DC
  Administered 2015-11-03 (×2): via INTRAVENOUS

## 2015-11-03 MED ORDER — LIDOCAINE 2% (20 MG/ML) 5 ML SYRINGE
INTRAMUSCULAR | Status: AC
  Filled 2015-11-03: qty 5

## 2015-11-03 MED ORDER — GLYCOPYRROLATE 0.2 MG/ML IJ SOLN: 0.2000 mg | Freq: Once | INTRAMUSCULAR | Status: DC | PRN

## 2015-11-03 MED ORDER — FENTANYL CITRATE (PF) 100 MCG/2ML IJ SOLN: 25.0000 ug | INTRAMUSCULAR | Status: DC | PRN

## 2015-11-03 MED ORDER — LIDOCAINE-EPINEPHRINE 1 %-1:100000 IJ SOLN
INTRAMUSCULAR | Status: AC
  Filled 2015-11-03: qty 1

## 2015-11-03 MED ORDER — CEFAZOLIN SODIUM-DEXTROSE 2-3 GM-% IV SOLR
INTRAVENOUS | Status: DC | PRN
  Administered 2015-11-03: 2 g via INTRAVENOUS

## 2015-11-03 MED ORDER — SCOPOLAMINE 1 MG/3DAYS TD PT72: 1.0000 | MEDICATED_PATCH | Freq: Once | TRANSDERMAL | Status: DC | PRN

## 2015-11-03 MED ORDER — MIDAZOLAM HCL 5 MG/5ML IJ SOLN
INTRAMUSCULAR | Status: DC | PRN
  Administered 2015-11-03: 2 mg via INTRAVENOUS

## 2015-11-03 MED ORDER — DEXAMETHASONE SODIUM PHOSPHATE 4 MG/ML IJ SOLN
INTRAMUSCULAR | Status: DC | PRN
  Administered 2015-11-03: 10 mg via INTRAVENOUS

## 2015-11-03 MED ORDER — HYDROCODONE-ACETAMINOPHEN 5-325 MG PO TABS
1.0000 | ORAL_TABLET | Freq: Once | ORAL | Status: AC
  Administered 2015-11-03: 1 via ORAL

## 2015-11-03 MED ORDER — PHENYLEPHRINE HCL 10 MG/ML IJ SOLN
INTRAMUSCULAR | Status: DC | PRN
  Administered 2015-11-03: 80 ug via INTRAVENOUS

## 2015-11-03 MED ORDER — ONDANSETRON HCL 4 MG/2ML IJ SOLN
INTRAMUSCULAR | Status: DC | PRN
  Administered 2015-11-03: 4 mg via INTRAVENOUS

## 2015-11-03 MED ORDER — METOCLOPRAMIDE HCL 5 MG/ML IJ SOLN: 10.0000 mg | Freq: Once | INTRAMUSCULAR | Status: DC | PRN

## 2015-11-03 MED ORDER — HYDROCODONE-ACETAMINOPHEN 7.5-325 MG PO TABS: 1.0000 | ORAL_TABLET | Freq: Once | ORAL | Status: DC | PRN

## 2015-11-03 MED ORDER — LIDOCAINE HCL (CARDIAC) 20 MG/ML IV SOLN
INTRAVENOUS | Status: DC | PRN
  Administered 2015-11-03: 30 mg via INTRAVENOUS

## 2015-11-03 MED ORDER — PROPOFOL 10 MG/ML IV BOLUS
INTRAVENOUS | Status: DC | PRN
  Administered 2015-11-03: 50 mg via INTRAVENOUS
  Administered 2015-11-03: 150 mg via INTRAVENOUS

## 2015-11-03 MED ORDER — FENTANYL CITRATE (PF) 100 MCG/2ML IJ SOLN: 50.0000 ug | INTRAMUSCULAR | Status: DC | PRN

## 2015-11-03 MED ORDER — MEPERIDINE HCL 25 MG/ML IJ SOLN: 6.2500 mg | INTRAMUSCULAR | Status: DC | PRN

## 2015-11-03 MED ORDER — ONDANSETRON HCL 4 MG/2ML IJ SOLN
INTRAMUSCULAR | Status: AC
  Filled 2015-11-03: qty 2

## 2015-11-03 SURGICAL SUPPLY — 58 items
BAG DECANTER FOR FLEXI CONT (MISCELLANEOUS) IMPLANT
BENZOIN TINCTURE PRP APPL 2/3 (GAUZE/BANDAGES/DRESSINGS) ×2 IMPLANT
BLADE KNIFE PERSONA 10 (BLADE) ×2 IMPLANT
BLADE KNIFE PERSONA 15 (BLADE) IMPLANT
BNDG COHESIVE 4X5 TAN STRL (GAUZE/BANDAGES/DRESSINGS) ×2 IMPLANT
BRIEF STRETCH FOR OB PAD LRG (UNDERPADS AND DIAPERS) IMPLANT
CANISTER SUCT 1200ML W/VALVE (MISCELLANEOUS) ×2 IMPLANT
COVER BACK TABLE 60X90IN (DRAPES) ×2 IMPLANT
COVER MAYO STAND STRL (DRAPES) ×2 IMPLANT
DECANTER SPIKE VIAL GLASS SM (MISCELLANEOUS) IMPLANT
DRAIN CHANNEL 10M FLAT 3/4 FLT (DRAIN) IMPLANT
DRAIN CHANNEL 7F FF FLAT (WOUND CARE) IMPLANT
DRAPE U-SHAPE 76X120 STRL (DRAPES) ×4 IMPLANT
DRSG PAD ABDOMINAL 8X10 ST (GAUZE/BANDAGES/DRESSINGS) ×2 IMPLANT
DRSG TELFA 3X8 NADH (GAUZE/BANDAGES/DRESSINGS) ×2 IMPLANT
ELECT REM PT RETURN 9FT ADLT (ELECTROSURGICAL) ×2
ELECTRODE REM PT RTRN 9FT ADLT (ELECTROSURGICAL) ×1 IMPLANT
EVACUATOR SILICONE 100CC (DRAIN) IMPLANT
GAUZE SPONGE 4X4 12PLY STRL (GAUZE/BANDAGES/DRESSINGS) ×2 IMPLANT
GAUZE XEROFORM 5X9 LF (GAUZE/BANDAGES/DRESSINGS) ×2 IMPLANT
GLOVE BIO SURGEON STRL SZ 6.5 (GLOVE) IMPLANT
GLOVE BIO SURGEON STRL SZ7.5 (GLOVE) ×2 IMPLANT
GLOVE BIOGEL M STRL SZ7.5 (GLOVE) IMPLANT
GLOVE BIOGEL PI IND STRL 8 (GLOVE) ×1 IMPLANT
GLOVE BIOGEL PI INDICATOR 8 (GLOVE) ×1
GLOVE ECLIPSE 7.0 STRL STRAW (GLOVE) ×2 IMPLANT
GOWN STRL REUS W/ TWL XL LVL3 (GOWN DISPOSABLE) ×2 IMPLANT
GOWN STRL REUS W/TWL XL LVL3 (GOWN DISPOSABLE) ×2
IV NS 500ML (IV SOLUTION) ×1
IV NS 500ML BAXH (IV SOLUTION) ×1 IMPLANT
MARKER SKIN DUAL TIP RULER LAB (MISCELLANEOUS) IMPLANT
NDL SAFETY ECLIPSE 18X1.5 (NEEDLE) IMPLANT
NEEDLE HYPO 18GX1.5 SHARP (NEEDLE)
NEEDLE HYPO 25X1 1.5 SAFETY (NEEDLE) ×2 IMPLANT
NEEDLE SPNL 18GX3.5 QUINCKE PK (NEEDLE) ×2 IMPLANT
NS IRRIG 1000ML POUR BTL (IV SOLUTION) ×2 IMPLANT
PACK BASIN DAY SURGERY FS (CUSTOM PROCEDURE TRAY) ×2 IMPLANT
PEN SKIN MARKING BROAD TIP (MISCELLANEOUS) ×2 IMPLANT
PIN SAFETY STERILE (MISCELLANEOUS) IMPLANT
SHEET MEDIUM DRAPE 40X70 STRL (DRAPES) ×4 IMPLANT
SLEEVE SCD COMPRESS KNEE MED (MISCELLANEOUS) ×2 IMPLANT
SPONGE LAP 18X18 X RAY DECT (DISPOSABLE) ×2 IMPLANT
STAPLER VISISTAT 35W (STAPLE) IMPLANT
STOCKINETTE IMPERVIOUS LG (DRAPES) ×2 IMPLANT
STRIP SUTURE WOUND CLOSURE 1/2 (SUTURE) ×2 IMPLANT
SUT ETHILON 3 0 FSL (SUTURE) ×2 IMPLANT
SUT MNCRL AB 3-0 PS2 18 (SUTURE) ×2 IMPLANT
SUT MON AB 2-0 CT1 36 (SUTURE) ×2 IMPLANT
SUT PROLENE 4 0 P 3 18 (SUTURE) IMPLANT
SYR 20CC LL (SYRINGE) ×2 IMPLANT
SYR 50ML LL SCALE MARK (SYRINGE) IMPLANT
SYR CONTROL 10ML LL (SYRINGE) ×2 IMPLANT
TOWEL OR 17X24 6PK STRL BLUE (TOWEL DISPOSABLE) ×6 IMPLANT
TRAY DSU PREP LF (CUSTOM PROCEDURE TRAY) ×2 IMPLANT
TUBE CONNECTING 20X1/4 (TUBING) ×2 IMPLANT
UNDERPAD 30X30 (UNDERPADS AND DIAPERS) ×2 IMPLANT
VAC PENCILS W/TUBING CLEAR (MISCELLANEOUS) ×2 IMPLANT
YANKAUER SUCT BULB TIP NO VENT (SUCTIONS) ×2 IMPLANT

## 2015-11-03 NOTE — Discharge Instructions (Signed)
Activity (include date of return to work if known) °As tolerated: NO showers °NO driving °No heavy activities ° °Diet:regular No restrictions: ° °Wound Care: Keep dressing clean & dry ° °Do not change dressings °For Abdominoplasties wear abdominal binder °Special Instructions: °Do not raise arms up °Continue to empty, recharge, & record drainage from J-P drains &/or °Hemovacs 2-3 times a day, as needed. °Call Doctor if any unusual problems occur such as pain, excessive °Bleeding, unrelieved Nausea/vomiting, Fever &/or chills °When lying down, keep head elevated on 2-3 pillows or back-rest °For Addominoplasties the Jack-knife position °Follow-up appointment: Please call the office. ° °The patient received discharge instruction from:___________________________________________ ° ° °Patient signature ________________________________________ / Date___________ ° ° ° °Signature of individual providing instructions/ Date________________     ° ° °Post Anesthesia Home Care Instructions ° °Activity: °Get plenty of rest for the remainder of the day. A responsible adult should stay with you for 24 hours following the procedure.  °For the next 24 hours, DO NOT: °-Drive a car °-Operate machinery °-Drink alcoholic beverages °-Take any medication unless instructed by your physician °-Make any legal decisions or sign important papers. ° °Meals: °Start with liquid foods such as gelatin or soup. Progress to regular foods as tolerated. Avoid greasy, spicy, heavy foods. If nausea and/or vomiting occur, drink only clear liquids until the nausea and/or vomiting subsides. Call your physician if vomiting continues. ° °Special Instructions/Symptoms: °Your throat may feel dry or sore from the anesthesia or the breathing tube placed in your throat during surgery. If this causes discomfort, gargle with warm salt water. The discomfort should disappear within 24 hours. ° °If you had a scopolamine patch placed behind your ear for the management of  post- operative nausea and/or vomiting: ° °1. The medication in the patch is effective for 72 hours, after which it should be removed.  Wrap patch in a tissue and discard in the trash. Wash hands thoroughly with soap and water. °2. You may remove the patch earlier than 72 hours if you experience unpleasant side effects which may include dry mouth, dizziness or visual disturbances. °3. Avoid touching the patch. Wash your hands with soap and water after contact with the patch. °  °         °

## 2015-11-03 NOTE — Brief Op Note (Signed)
11/03/2015  12:10 PM  PATIENT:  Carol Prince  45 y.o. female  PRE-OPERATIVE DIAGNOSIS:  HIDRABENITIS  POST-OPERATIVE DIAGNOSIS:  HIDRABENITIS  PROCEDURE:  Procedure(s): EXCISION HIDRADENITIS  LEFT AXILLA (Left)  SURGEON:  Surgeon(s) and Role:    * Louisa SecondGerald Etna Forquer, MD - Primary  PHYSICIAN ASSISTANT:   ASSISTANTS: none   ANESTHESIA:   general  EBL:  Total I/O In: 1500 [I.V.:1500] Out: 10 [Blood:10]  BLOOD ADMINISTERED:none  DRAINS: none   LOCAL MEDICATIONS USED:  XYLOCAINE   SPECIMEN:  Excision  DISPOSITION OF SPECIMEN:  PATHOLOGY  COUNTS:  YES  TOURNIQUET:  * No tourniquets in log *  DICTATION: .Other Dictation: Dictation Number [id number]  PLAN OF CARE: Discharge to home after PACU  PATIENT DISPOSITION:  PACU - hemodynamically stable.   Delay start of Pharmacological VTE agent (>24hrs) due to surgical blood loss or risk of bleeding: yes

## 2015-11-03 NOTE — Anesthesia Preprocedure Evaluation (Signed)
Anesthesia Evaluation  Patient identified by MRN, date of birth, ID band Patient awake    Reviewed: Allergy & Precautions, NPO status , Patient's Chart, lab work & pertinent test results  Airway Mallampati: I       Dental no notable dental hx. (+) Teeth Intact   Pulmonary neg pulmonary ROS,    Pulmonary exam normal breath sounds clear to auscultation       Cardiovascular negative cardio ROS Normal cardiovascular exam Rhythm:Regular Rate:Normal     Neuro/Psych Seizures -, Well Controlled,  Anxiety    GI/Hepatic Neg liver ROS, GERD  Medicated and Controlled,  Endo/Other  Obesity  Renal/GU negative Renal ROS  negative genitourinary   Musculoskeletal negative musculoskeletal ROS (+)   Abdominal (+) + obese,   Peds  Hematology negative hematology ROS (+)   Anesthesia Other Findings   Reproductive/Obstetrics                             Anesthesia Physical Anesthesia Plan  ASA: II  Anesthesia Plan: General   Post-op Pain Management:    Induction: Intravenous  Airway Management Planned: LMA  Additional Equipment:   Intra-op Plan:   Post-operative Plan: Extubation in OR  Informed Consent: I have reviewed the patients History and Physical, chart, labs and discussed the procedure including the risks, benefits and alternatives for the proposed anesthesia with the patient or authorized representative who has indicated his/her understanding and acceptance.   Dental advisory given  Plan Discussed with: CRNA, Anesthesiologist and Surgeon  Anesthesia Plan Comments:         Anesthesia Quick Evaluation

## 2015-11-03 NOTE — H&P (Signed)
Carol Prince is an 45 y.o. female.   Chief Complaint: Hidraidnitis left axilla HPI: Increased pain and drainage  Past Medical History:  Diagnosis Date  . GERD (gastroesophageal reflux disease)   . Seizures (HCC)    last seizure- 2015     Past Surgical History:  Procedure Laterality Date  . ABDOMINAL HYSTERECTOMY    . SHOULDER ARTHROSCOPY WITH SUBACROMIAL DECOMPRESSION AND OPEN ROTATOR C Left 08/15/2014   Procedure: LEFT SHOULDER ARTHROSCOPY WITH SUBACROMIAL DECOMPRESSION AND MINI OPEN ROTATOR CUFF REPAIR;  Surgeon: Jene EveryJeffrey Beane, MD;  Location: [facility];  Service: Orthopedics;  Laterality: Left;  With ANCHORS    History reviewed. No pertinent family history. Social History:  reports that she has never smoked. She has never used smokeless tobacco. She reports that she does not drink alcohol or use drugs.  Allergies:  Allergies  Allergen Reactions  . Ampicillin Hives and Rash    Medications Prior to Admission  Medication Sig Dispense Refill  . carbamazepine (TEGRETOL) 200 MG tablet Take 200 mg by mouth 2 (two) times daily between meals.       No results found for this or any previous visit (from the past 48 hour(s)). No results found.  Review of Systems  Constitutional: Negative.   HENT: Negative.   Eyes: Negative.   Respiratory: Negative.   Cardiovascular: Negative.   Gastrointestinal: Negative.   Genitourinary: Negative.   Musculoskeletal: Negative.   Skin: Positive for itching and rash.  Neurological: Negative.   Endo/Heme/Allergies: Negative.   Psychiatric/Behavioral: Negative.     Blood pressure 119/73, pulse 71, temperature 98.5 F (36.9 C), temperature source Oral, resp. rate 20, height 5\' 4"  (1.626 m), weight 99.3 kg (219 lb), SpO2 100 %. Physical Exam   Assessment/Plan Hidraidnitis suppurativa left axilla for excision and Estill Battenyan POLLACK FLAP CLOSURE  Carol Vangieson L, MD 11/03/2015, 10:22 AM

## 2015-11-03 NOTE — Anesthesia Postprocedure Evaluation (Signed)
Anesthesia Post Note  Patient: Philis Fendtyisha Szafran  Procedure(s) Performed: Procedure(s) (LRB): EXCISION HIDRADENITIS  LEFT AXILLA (Left)  Patient location during evaluation: PACU Anesthesia Type: General Level of consciousness: awake and alert and oriented Pain management: pain level controlled Vital Signs Assessment: post-procedure vital signs reviewed and stable Respiratory status: spontaneous breathing, nonlabored ventilation and respiratory function stable Cardiovascular status: blood pressure returned to baseline and stable Postop Assessment: no signs of nausea or vomiting Anesthetic complications: no    Last Vitals:  Vitals:   11/03/15 1158 11/03/15 1159  BP: 125/85   Pulse: 96 84  Resp: 15 15  Temp:      Last Pain:  Vitals:   11/03/15 1215  TempSrc:   PainSc: 2                  Angelina Neece A.

## 2015-11-03 NOTE — Anesthesia Procedure Notes (Signed)
Procedure Name: LMA Insertion Date/Time: 11/03/2015 10:57 AM Performed by: Genevieve NorlanderLINKA, Paysen Goza L Pre-anesthesia Checklist: Patient identified, Emergency Drugs available, Suction available, Patient being monitored and Timeout performed Patient Re-evaluated:Patient Re-evaluated prior to inductionOxygen Delivery Method: Circle system utilized Preoxygenation: Pre-oxygenation with 100% oxygen Intubation Type: IV induction Ventilation: Mask ventilation without difficulty LMA: LMA inserted LMA Size: 4.0 Number of attempts: 1 Airway Equipment and Method: Bite block Placement Confirmation: positive ETCO2 Tube secured with: Tape Dental Injury: Teeth and Oropharynx as per pre-operative assessment

## 2015-11-03 NOTE — Transfer of Care (Signed)
Immediate Anesthesia Transfer of Care Note  Patient: Carol Prince  Procedure(s) Performed: Procedure(s): EXCISION HIDRADENITIS  LEFT AXILLA (Left)  Patient Location: PACU  Anesthesia Type:General  Level of Consciousness: awake and patient cooperative  Airway & Oxygen Therapy: Patient Spontanous Breathing and Patient connected to face mask oxygen  Post-op Assessment: Report given to RN and Post -op Vital signs reviewed and stable  Post vital signs: Reviewed and stable  Last Vitals:  Vitals:   11/03/15 0943  BP: 119/73  Pulse: 71  Resp: 20  Temp: 36.9 C    Last Pain:  Vitals:   11/03/15 0943  TempSrc: Oral         Complications: No apparent anesthesia complications

## 2015-11-04 ENCOUNTER — Encounter (HOSPITAL_BASED_OUTPATIENT_CLINIC_OR_DEPARTMENT_OTHER): Payer: Self-pay | Admitting: Specialist

## 2015-11-04 NOTE — Op Note (Signed)
Carol Prince:  Engel, Tamar              ACCOUNT NO.:  [id number]  MEDICAL RECORD NO.:  [id number]  LOCATION:                                 FACILITY:  PHYSICIAN:  Earvin HansenGerald L. Adalynne Steffensmeier, M.D.DATE OF BIRTH:  05-23-1970  DATE OF PROCEDURE:  11/03/2015 DATE OF DISCHARGE:                              OPERATIVE REPORT   This is a 45 year old lady with history of severe hidradenitis suppurativa involving the left axilla, had increased irritations and flaps over the past with I and D's done multiple times.  PROCEDURES DONE:  Excision of hidradenitis left axilla with Ryan-Pollock flap closure.  All procedures in detail explained to the patient who understands and consents to surgery, understood the potential risk and complications.  DESCRIPTION OF PROCEDURE:  The patient was taken to the operating room, placed on the operating room table in the supine position, was given adequate general anesthesia, intubated orally.  A prep was done to the left axillary chest areas using Hibiclens soap and solution and walled off with sterile towels and drapes so as to make a sterile field.  A 1/3% Xylocaine with epinephrine 1:300,000 concentration was injected locally, a total of 100 mL and allowed to sit.  A large elliptical excision was made of all the hidradenitis in the left axilla including all hair-bearing area down to underlying superficial fascia.  Hemostasis maintained with Bovie anticoagulation.  Next, the medial and lateral flaps were freed up significantly approximately 6 cm and then rotational coverage and closure was done with deep 2-0 Monocryl sutures, subcutaneous sutures of 2-0 Monocryl was placed as well, subdermal suture of 3-0 Monocryl, then a running subcuticular stitch of 3-0 Monocryl and a running 3-0 nylon.  Steri-Strips and soft dressing were applied to all the areas.  She withstood the procedure very well, was taken to recovery room in excellent condition.  Estimated blood loss less  than 50 mL.     Yaakov GuthrieGerald L. Shon Houghruesdale, M.D.   ______________________________ Yaakov GuthrieGerald L. Shon Houghruesdale, M.D.    Cathie HoopsGLT/MEDQ  D:  11/03/2015  T:  11/04/2015  Job:  [id number]

## 2016-03-22 DIAGNOSIS — G40001 Localization-related (focal) (partial) idiopathic epilepsy and epileptic syndromes with seizures of localized onset, not intractable, with status epilepticus: Secondary | ICD-10-CM | POA: Diagnosis not present

## 2016-03-22 DIAGNOSIS — E6609 Other obesity due to excess calories: Secondary | ICD-10-CM | POA: Diagnosis not present

## 2016-07-22 DIAGNOSIS — Q828 Other specified congenital malformations of skin: Secondary | ICD-10-CM | POA: Diagnosis not present

## 2016-07-22 DIAGNOSIS — E6609 Other obesity due to excess calories: Secondary | ICD-10-CM | POA: Diagnosis not present

## 2016-07-22 DIAGNOSIS — Z833 Family history of diabetes mellitus: Secondary | ICD-10-CM | POA: Diagnosis not present

## 2016-07-22 DIAGNOSIS — G40001 Localization-related (focal) (partial) idiopathic epilepsy and epileptic syndromes with seizures of localized onset, not intractable, with status epilepticus: Secondary | ICD-10-CM | POA: Diagnosis not present

## 2016-07-31 DIAGNOSIS — G40001 Localization-related (focal) (partial) idiopathic epilepsy and epileptic syndromes with seizures of localized onset, not intractable, with status epilepticus: Secondary | ICD-10-CM | POA: Diagnosis not present

## 2016-07-31 DIAGNOSIS — Z833 Family history of diabetes mellitus: Secondary | ICD-10-CM | POA: Diagnosis not present

## 2016-07-31 DIAGNOSIS — E6609 Other obesity due to excess calories: Secondary | ICD-10-CM | POA: Diagnosis not present

## 2016-07-31 DIAGNOSIS — M7732 Calcaneal spur, left foot: Secondary | ICD-10-CM | POA: Diagnosis not present

## 2016-08-04 DIAGNOSIS — M7732 Calcaneal spur, left foot: Secondary | ICD-10-CM | POA: Diagnosis not present

## 2016-08-04 DIAGNOSIS — M2170 Unequal limb length (acquired), unspecified site: Secondary | ICD-10-CM | POA: Diagnosis not present

## 2016-08-04 DIAGNOSIS — M722 Plantar fascial fibromatosis: Secondary | ICD-10-CM | POA: Diagnosis not present

## 2016-08-04 DIAGNOSIS — M76822 Posterior tibial tendinitis, left leg: Secondary | ICD-10-CM | POA: Diagnosis not present

## 2016-08-04 DIAGNOSIS — M71572 Other bursitis, not elsewhere classified, left ankle and foot: Secondary | ICD-10-CM | POA: Diagnosis not present

## 2016-08-11 DIAGNOSIS — M722 Plantar fascial fibromatosis: Secondary | ICD-10-CM | POA: Diagnosis not present

## 2016-08-11 DIAGNOSIS — M71572 Other bursitis, not elsewhere classified, left ankle and foot: Secondary | ICD-10-CM | POA: Diagnosis not present

## 2016-08-11 DIAGNOSIS — M2170 Unequal limb length (acquired), unspecified site: Secondary | ICD-10-CM | POA: Diagnosis not present

## 2016-08-25 DIAGNOSIS — M71572 Other bursitis, not elsewhere classified, left ankle and foot: Secondary | ICD-10-CM | POA: Diagnosis not present

## 2016-08-25 DIAGNOSIS — M722 Plantar fascial fibromatosis: Secondary | ICD-10-CM | POA: Diagnosis not present

## 2016-09-03 ENCOUNTER — Other Ambulatory Visit: Payer: Self-pay | Admitting: Family Medicine

## 2016-09-03 DIAGNOSIS — Z1231 Encounter for screening mammogram for malignant neoplasm of breast: Secondary | ICD-10-CM

## 2016-09-10 ENCOUNTER — Ambulatory Visit
Admission: RE | Admit: 2016-09-10 | Discharge: 2016-09-10 | Disposition: A | Discharge status: Home / Self Care | Payer: BLUE CROSS/BLUE SHIELD | Source: Ambulatory Visit | Attending: Family Medicine | Admitting: Family Medicine

## 2016-09-10 DIAGNOSIS — Z1231 Encounter for screening mammogram for malignant neoplasm of breast: Secondary | ICD-10-CM

## 2016-10-06 DIAGNOSIS — H10413 Chronic giant papillary conjunctivitis, bilateral: Secondary | ICD-10-CM | POA: Diagnosis not present

## 2016-10-09 ENCOUNTER — Ambulatory Visit (HOSPITAL_COMMUNITY)
Admission: EM | Admit: 2016-10-09 | Discharge: 2016-10-09 | Disposition: A | Discharge status: Home / Self Care | Payer: BLUE CROSS/BLUE SHIELD | Attending: Family Medicine | Admitting: Family Medicine

## 2016-10-09 ENCOUNTER — Encounter (HOSPITAL_COMMUNITY): Payer: Self-pay

## 2016-10-09 DIAGNOSIS — R079 Chest pain, unspecified: Secondary | ICD-10-CM | POA: Diagnosis not present

## 2016-10-09 DIAGNOSIS — K219 Gastro-esophageal reflux disease without esophagitis: Secondary | ICD-10-CM

## 2016-10-09 MED ORDER — OMEPRAZOLE 20 MG PO CPDR: 20.0000 mg | DELAYED_RELEASE_CAPSULE | Freq: Every day | ORAL | 1 refills | Status: AC

## 2016-10-09 NOTE — Discharge Instructions (Addendum)
Please return here or to the Emergency Department immediately should you feel worse in any way or have any of the following symptoms: increasing or different chest pain, pain that spreads to your arm, neck, jaw, back or abdomen, shortness of breath, or nausea and vomiting. ° °

## 2016-10-09 NOTE — ED Triage Notes (Signed)
Patient presents to [facility] with complains of chest discomfort x1 week, pt thinks she may have gas but not sure because she took some baking soda and warm water but has no relief and still has chest pain

## 2016-10-11 NOTE — ED Provider Notes (Signed)
  [facility]   [id number] 10/09/16 Arrival Time: 1717  ASSESSMENT & PLAN:  1. Nonspecific chest pain   2. Gastroesophageal reflux disease without esophagitis     Meds ordered this encounter  Medications  . omeprazole (PRILOSEC) 20 MG capsule    Sig: Take 1 capsule (20 mg total) by mouth daily.    Dispense:  30 capsule    Refill:  1   ECG normal. Suspect GERD etiology. Close observation. Reviewed expectations re: course of current medical issues. Questions answered. Outlined signs and symptoms indicating need for more acute intervention. Patient verbalized understanding. After Visit Summary given.   SUBJECTIVE:  Carol Prince is a 46 y.o. female who presents with complaint of on/off non-radiating epigastric/chest discomfort for the past one week. Feels like "pressure or burning" of anterior chest. Questions if worse after eating sometimes. Is belching more. No SOB/n/v/diaphoresis. Exertion does not worsen symptoms. Able to sleep through the night. Normal PO intake. No OTC treatment.  ROS: As per HPI.   OBJECTIVE:  Vitals:   10/09/16 1815  BP: 114/78  Pulse: 75  Resp: 16  Temp: 98.1 F (36.7 C)  TempSrc: Oral  SpO2: 99%    General appearance: alert; no distress Eyes: PERRLA; EOMI; conjunctiva normal HENT: normocephalic; atraumatic; TMs normal; nasal mucosa normal; oral mucosa normal Neck: supple Lungs: clear to auscultation bilaterally Heart: regular rate and rhythm Abdomen: soft, non-tender; bowel sounds normal; no masses or organomegaly; no guarding or rebound tenderness Back: no CVA tenderness Extremities: no cyanosis or edema; symmetrical with no gross deformities Skin: warm and dry Psychological: alert and cooperative; normal mood and affect   Allergies  Allergen Reactions  . Ampicillin Hives and Rash    Past Medical History:  Diagnosis Date  . GERD (gastroesophageal reflux disease)   . Seizures (HCC)    last seizure- 2015    Social  History   Social History  . Marital status: Single    Spouse name: N/A  . Number of children: N/A  . Years of education: N/A   Occupational History  . Not on file.   Social History Main Topics  . Smoking status: Never Smoker  . Smokeless tobacco: Never Used  . Alcohol use No  . Drug use: No  . Sexual activity: No   Other Topics Concern  . Not on file   Social History Narrative  . No narrative on file   Family History  Problem Relation Age of Onset  . Breast cancer Maternal Aunt    Past Surgical History:  Procedure Laterality Date  . ABDOMINAL HYSTERECTOMY    . HYDRADENITIS EXCISION Left 11/03/2015   Procedure: EXCISION HIDRADENITIS  LEFT AXILLA;  Surgeon: Louisa Second, MD;  Location: [facility];  Service: Plastics;  Laterality: Left;  . SHOULDER ARTHROSCOPY WITH SUBACROMIAL DECOMPRESSION AND OPEN ROTATOR C Left 08/15/2014   Procedure: LEFT SHOULDER ARTHROSCOPY WITH SUBACROMIAL DECOMPRESSION AND MINI OPEN ROTATOR CUFF REPAIR;  Surgeon: Jene Every, MD;  Location: [facility];  Service: Orthopedics;  Laterality: Left;  With Lynford Citizen, MD 10/11/16 [phone]

## 2016-12-29 DIAGNOSIS — R21 Rash and other nonspecific skin eruption: Secondary | ICD-10-CM | POA: Diagnosis not present

## 2017-01-10 DIAGNOSIS — R21 Rash and other nonspecific skin eruption: Secondary | ICD-10-CM | POA: Diagnosis not present

## 2017-01-12 DIAGNOSIS — M722 Plantar fascial fibromatosis: Secondary | ICD-10-CM | POA: Diagnosis not present

## 2017-01-13 DIAGNOSIS — Z01812 Encounter for preprocedural laboratory examination: Secondary | ICD-10-CM | POA: Diagnosis not present

## 2017-01-24 DIAGNOSIS — J301 Allergic rhinitis due to pollen: Secondary | ICD-10-CM | POA: Diagnosis not present

## 2017-01-24 DIAGNOSIS — E6609 Other obesity due to excess calories: Secondary | ICD-10-CM | POA: Diagnosis not present

## 2017-01-24 DIAGNOSIS — L308 Other specified dermatitis: Secondary | ICD-10-CM | POA: Diagnosis not present

## 2017-02-16 DIAGNOSIS — M722 Plantar fascial fibromatosis: Secondary | ICD-10-CM | POA: Diagnosis not present

## 2017-02-23 DIAGNOSIS — L810 Postinflammatory hyperpigmentation: Secondary | ICD-10-CM | POA: Diagnosis not present

## 2017-02-23 DIAGNOSIS — L281 Prurigo nodularis: Secondary | ICD-10-CM | POA: Diagnosis not present

## 2017-04-25 DIAGNOSIS — K8020 Calculus of gallbladder without cholecystitis without obstruction: Secondary | ICD-10-CM | POA: Diagnosis not present

## 2017-04-25 DIAGNOSIS — E6609 Other obesity due to excess calories: Secondary | ICD-10-CM | POA: Diagnosis not present

## 2017-04-25 DIAGNOSIS — G40001 Localization-related (focal) (partial) idiopathic epilepsy and epileptic syndromes with seizures of localized onset, not intractable, with status epilepticus: Secondary | ICD-10-CM | POA: Diagnosis not present

## 2017-04-26 ENCOUNTER — Other Ambulatory Visit: Payer: Self-pay | Admitting: Family Medicine

## 2017-04-26 DIAGNOSIS — K8021 Calculus of gallbladder without cholecystitis with obstruction: Secondary | ICD-10-CM

## 2017-05-05 ENCOUNTER — Other Ambulatory Visit: Payer: Self-pay

## 2017-07-13 DIAGNOSIS — M722 Plantar fascial fibromatosis: Secondary | ICD-10-CM | POA: Diagnosis not present

## 2017-08-18 ENCOUNTER — Other Ambulatory Visit: Payer: Self-pay | Admitting: Family Medicine

## 2017-08-18 ENCOUNTER — Ambulatory Visit
Admission: RE | Admit: 2017-08-18 | Discharge: 2017-08-18 | Disposition: A | Discharge status: Home / Self Care | Payer: BLUE CROSS/BLUE SHIELD | Source: Ambulatory Visit | Attending: Family Medicine | Admitting: Family Medicine

## 2017-08-18 DIAGNOSIS — M79674 Pain in right toe(s): Secondary | ICD-10-CM | POA: Diagnosis not present

## 2017-08-18 DIAGNOSIS — S99921A Unspecified injury of right foot, initial encounter: Secondary | ICD-10-CM | POA: Diagnosis not present

## 2017-08-18 DIAGNOSIS — Z6839 Body mass index (BMI) 39.0-39.9, adult: Secondary | ICD-10-CM | POA: Diagnosis not present

## 2017-08-18 DIAGNOSIS — T148XXA Other injury of unspecified body region, initial encounter: Secondary | ICD-10-CM

## 2017-09-30 DIAGNOSIS — Z1231 Encounter for screening mammogram for malignant neoplasm of breast: Secondary | ICD-10-CM | POA: Diagnosis not present

## 2018-03-02 DIAGNOSIS — G40001 Localization-related (focal) (partial) idiopathic epilepsy and epileptic syndromes with seizures of localized onset, not intractable, with status epilepticus: Secondary | ICD-10-CM | POA: Diagnosis not present

## 2018-03-03 DIAGNOSIS — R21 Rash and other nonspecific skin eruption: Secondary | ICD-10-CM | POA: Diagnosis not present

## 2018-03-03 DIAGNOSIS — Q828 Other specified congenital malformations of skin: Secondary | ICD-10-CM | POA: Diagnosis not present

## 2018-03-03 DIAGNOSIS — G40001 Localization-related (focal) (partial) idiopathic epilepsy and epileptic syndromes with seizures of localized onset, not intractable, with status epilepticus: Secondary | ICD-10-CM | POA: Diagnosis not present

## 2018-03-03 DIAGNOSIS — J301 Allergic rhinitis due to pollen: Secondary | ICD-10-CM | POA: Diagnosis not present

## 2018-03-29 DIAGNOSIS — E6609 Other obesity due to excess calories: Secondary | ICD-10-CM | POA: Diagnosis not present

## 2018-03-29 DIAGNOSIS — G40001 Localization-related (focal) (partial) idiopathic epilepsy and epileptic syndromes with seizures of localized onset, not intractable, with status epilepticus: Secondary | ICD-10-CM | POA: Diagnosis not present

## 2018-05-01 DIAGNOSIS — R21 Rash and other nonspecific skin eruption: Secondary | ICD-10-CM | POA: Diagnosis not present

## 2018-05-01 DIAGNOSIS — J301 Allergic rhinitis due to pollen: Secondary | ICD-10-CM | POA: Diagnosis not present

## 2018-05-01 DIAGNOSIS — J392 Other diseases of pharynx: Secondary | ICD-10-CM | POA: Diagnosis not present

## 2018-05-01 DIAGNOSIS — Q828 Other specified congenital malformations of skin: Secondary | ICD-10-CM | POA: Diagnosis not present

## 2018-05-01 DIAGNOSIS — I10 Essential (primary) hypertension: Secondary | ICD-10-CM | POA: Diagnosis not present

## 2018-05-01 DIAGNOSIS — Z7189 Other specified counseling: Secondary | ICD-10-CM | POA: Diagnosis not present

## 2018-05-04 ENCOUNTER — Other Ambulatory Visit: Payer: Self-pay | Admitting: Family Medicine

## 2018-05-04 ENCOUNTER — Other Ambulatory Visit (HOSPITAL_COMMUNITY)
Admission: RE | Admit: 2018-05-04 | Discharge: 2018-05-04 | Disposition: A | Discharge status: Home / Self Care | Payer: BLUE CROSS/BLUE SHIELD | Source: Ambulatory Visit | Attending: Family Medicine | Admitting: Family Medicine

## 2018-05-04 DIAGNOSIS — F064 Anxiety disorder due to known physiological condition: Secondary | ICD-10-CM | POA: Diagnosis not present

## 2018-05-04 DIAGNOSIS — G40001 Localization-related (focal) (partial) idiopathic epilepsy and epileptic syndromes with seizures of localized onset, not intractable, with status epilepticus: Secondary | ICD-10-CM | POA: Diagnosis not present

## 2018-05-04 DIAGNOSIS — Z124 Encounter for screening for malignant neoplasm of cervix: Secondary | ICD-10-CM | POA: Diagnosis not present

## 2018-05-04 DIAGNOSIS — Z01419 Encounter for gynecological examination (general) (routine) without abnormal findings: Secondary | ICD-10-CM | POA: Diagnosis not present

## 2018-05-04 DIAGNOSIS — R7309 Other abnormal glucose: Secondary | ICD-10-CM | POA: Diagnosis not present

## 2018-05-24 LAB — CYTOLOGY - PAP
Chlamydia: NEGATIVE
Diagnosis: NEGATIVE
HPV: NOT DETECTED
Herpes: NEGATIVE
Neisseria Gonorrhea: NEGATIVE
Trichomonas: POSITIVE — AB

## 2018-08-24 DIAGNOSIS — Z1211 Encounter for screening for malignant neoplasm of colon: Secondary | ICD-10-CM | POA: Diagnosis not present

## 2018-09-16 DIAGNOSIS — Z20828 Contact with and (suspected) exposure to other viral communicable diseases: Secondary | ICD-10-CM | POA: Diagnosis not present

## 2018-09-27 DIAGNOSIS — Z23 Encounter for immunization: Secondary | ICD-10-CM | POA: Diagnosis not present

## 2018-09-27 DIAGNOSIS — Z7189 Other specified counseling: Secondary | ICD-10-CM | POA: Diagnosis not present

## 2018-09-27 DIAGNOSIS — R760 Raised antibody titer: Secondary | ICD-10-CM | POA: Diagnosis not present

## 2018-10-09 DIAGNOSIS — D122 Benign neoplasm of ascending colon: Secondary | ICD-10-CM | POA: Diagnosis not present

## 2018-10-09 DIAGNOSIS — D125 Benign neoplasm of sigmoid colon: Secondary | ICD-10-CM | POA: Diagnosis not present

## 2018-10-09 DIAGNOSIS — K635 Polyp of colon: Secondary | ICD-10-CM | POA: Diagnosis not present

## 2018-10-09 DIAGNOSIS — Z1211 Encounter for screening for malignant neoplasm of colon: Secondary | ICD-10-CM | POA: Diagnosis not present

## 2018-10-26 DIAGNOSIS — Z1231 Encounter for screening mammogram for malignant neoplasm of breast: Secondary | ICD-10-CM | POA: Diagnosis not present

## 2019-07-13 ENCOUNTER — Ambulatory Visit
Admission: RE | Admit: 2019-07-13 | Discharge: 2019-07-13 | Disposition: A | Discharge status: Home / Self Care | Payer: 59 | Source: Ambulatory Visit | Attending: Family Medicine | Admitting: Family Medicine

## 2019-07-13 ENCOUNTER — Other Ambulatory Visit: Payer: Self-pay | Admitting: Family Medicine

## 2019-07-13 DIAGNOSIS — R52 Pain, unspecified: Secondary | ICD-10-CM

## 2019-07-13 DIAGNOSIS — G839 Paralytic syndrome, unspecified: Secondary | ICD-10-CM

## 2020-09-24 IMAGING — CR DG LUMBAR SPINE COMPLETE 4+V
5 series · 5 of 5 positions shown · non-contrast
Comparison: None.

CLINICAL DATA: Low back pain, pelvic pain radiating to right
inguinal region

EXAM:
LUMBAR SPINE - COMPLETE 4+ VIEW

[t l-spine a.p.]
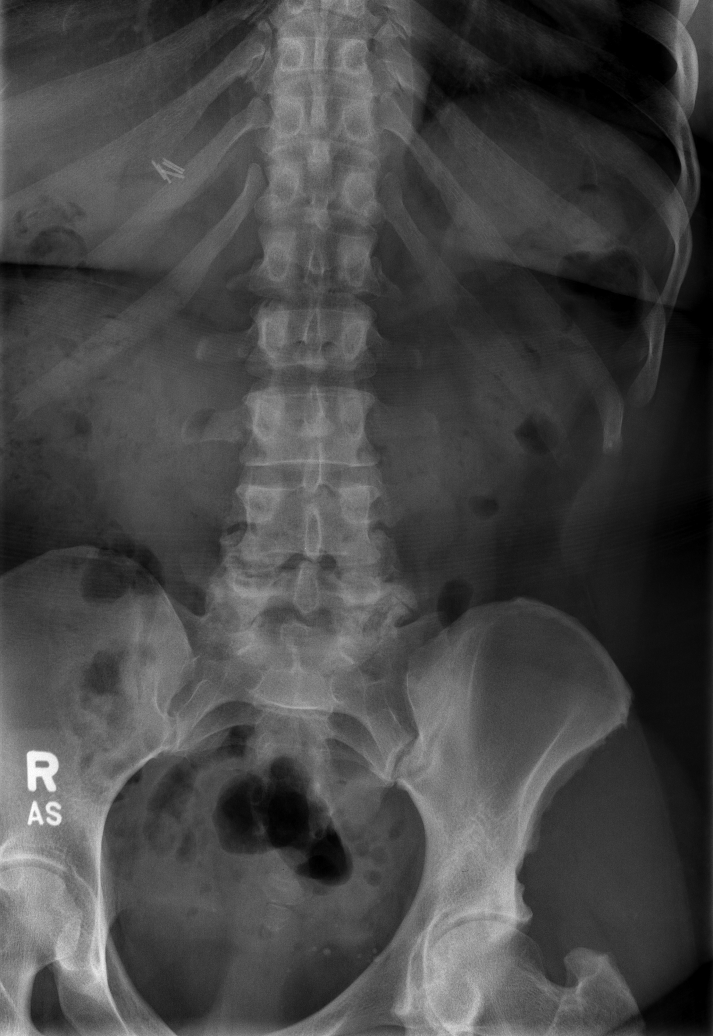

[t l-spine oblique exposure (1 of 2)]
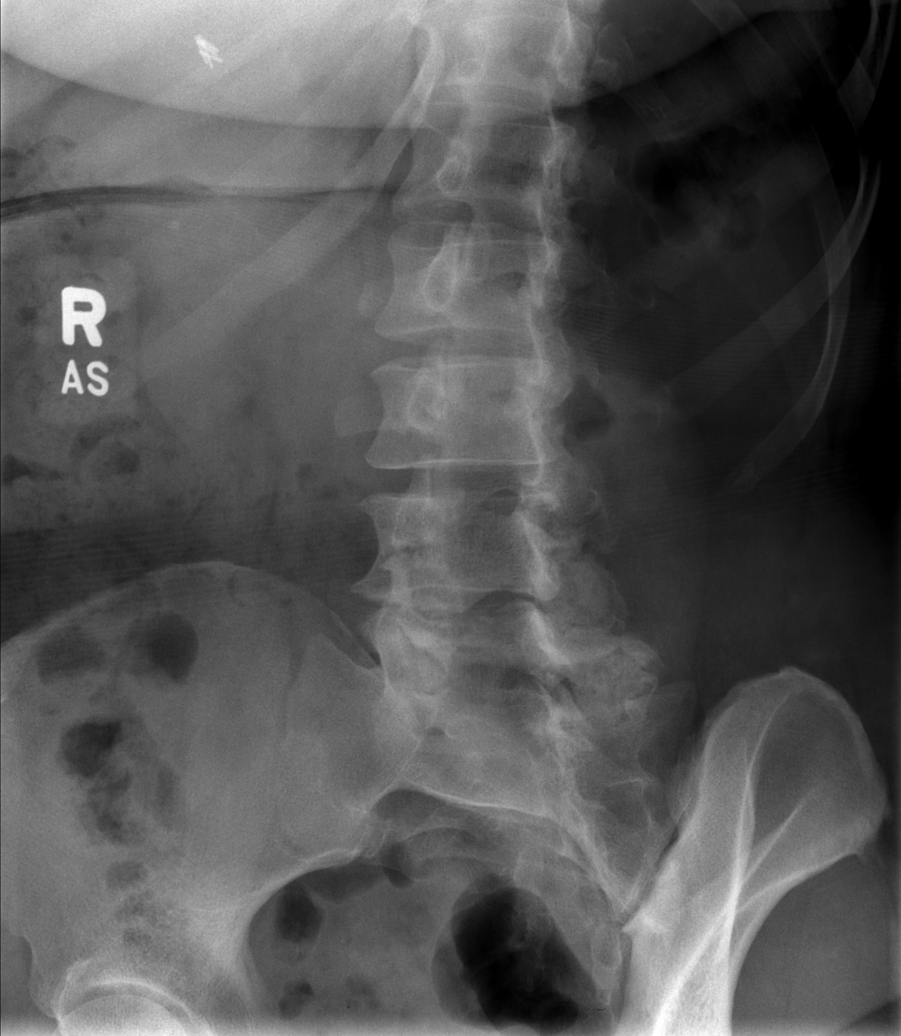

[t l-spine oblique exposure (2 of 2)]
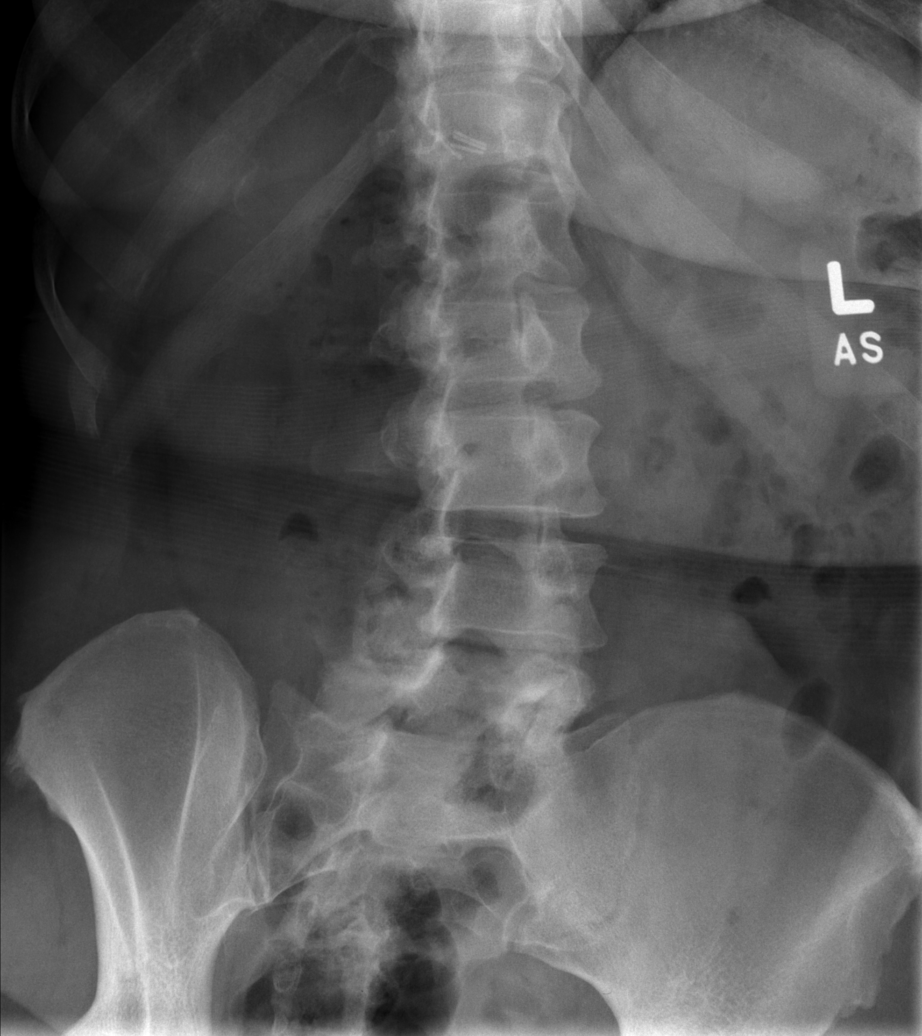

[t l-spine lat]
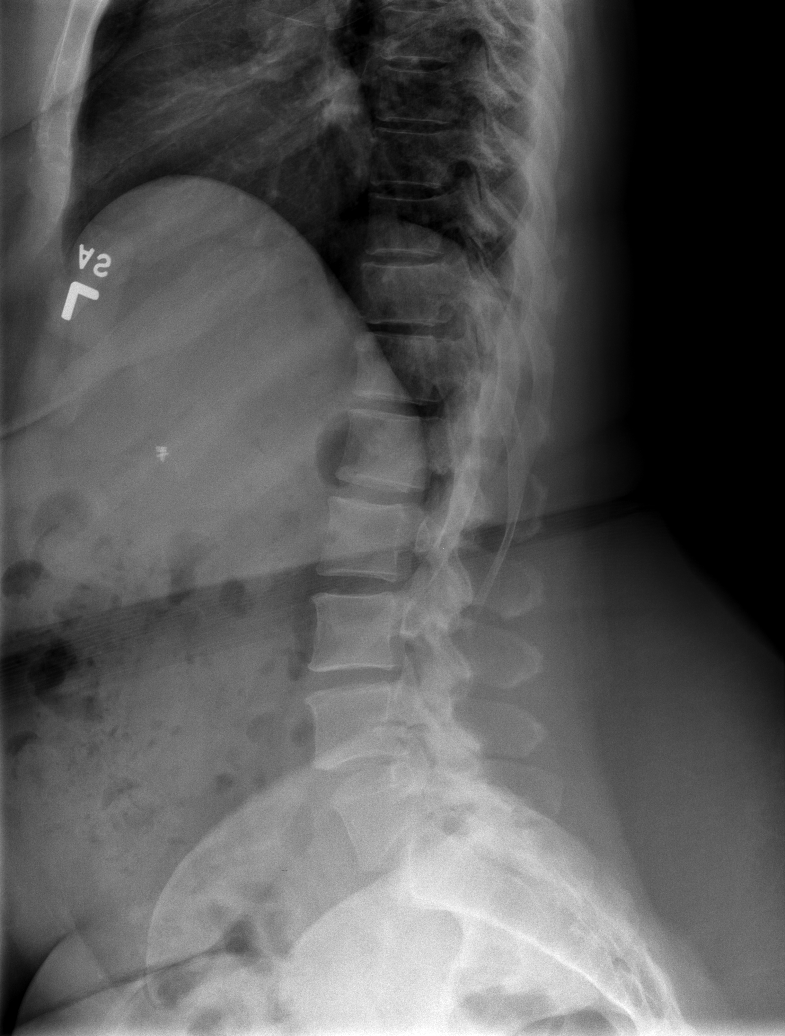

[t l-spine l5-s1 spot]
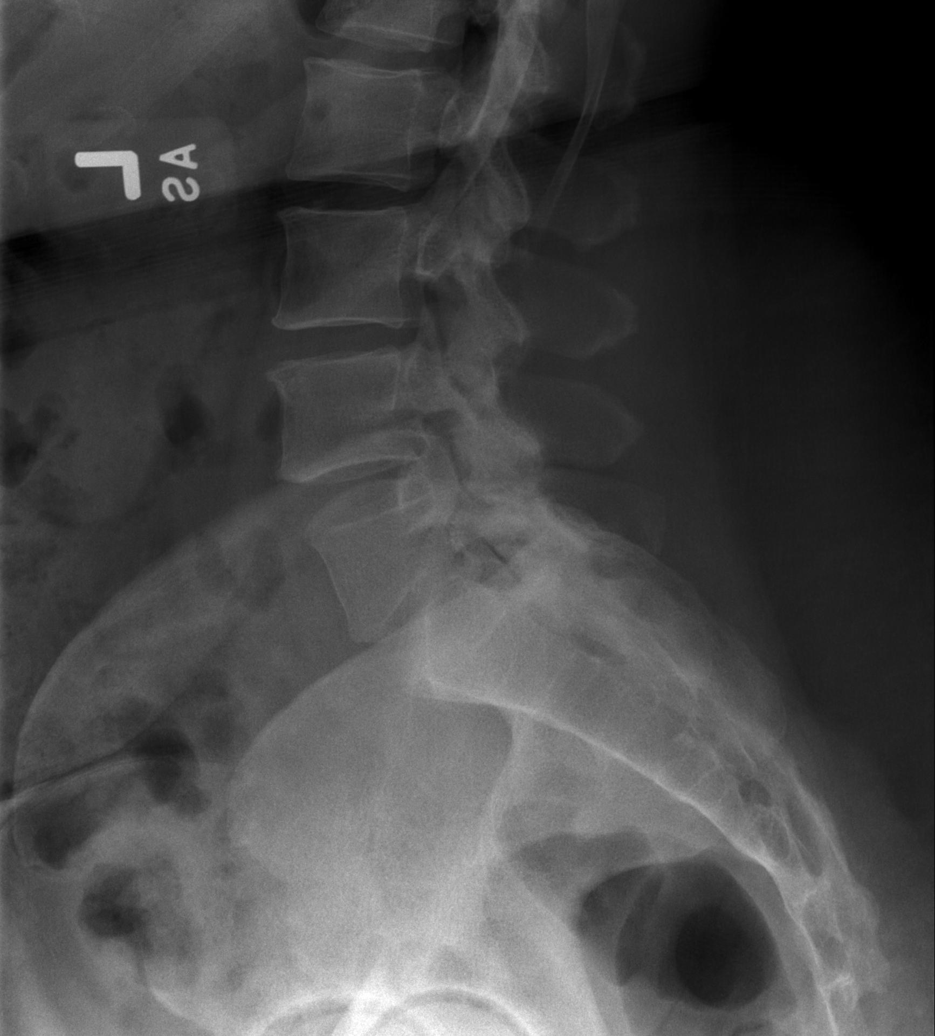

[5 of 5 positions shown; findings below may reference images not displayed]

FINDINGS: Frontal, bilateral oblique, lateral views of the lumbar spine are
obtained. There are 5 non-rib-bearing lumbar type vertebral bodies
identified in grossly normal anatomic alignment. There is prominent
facet hypertrophy at L4-5 and L5-S1. Disc spaces are well preserved.
Sacroiliac joints are normal. No acute fractures.
IMPRESSION: 1. Facet hypertrophic changes at L4-5 and L5-S1.
2. No acute bony abnormality.

## 2021-03-31 ENCOUNTER — Encounter (HOSPITAL_COMMUNITY): Payer: Self-pay | Admitting: Emergency Medicine

## 2021-03-31 ENCOUNTER — Other Ambulatory Visit: Payer: Self-pay

## 2021-03-31 ENCOUNTER — Emergency Department (HOSPITAL_COMMUNITY)
Admission: EM | Admit: 2021-03-31 | Discharge: 2021-03-31 | Disposition: A | Discharge status: Home / Self Care | Payer: Commercial Managed Care - HMO | Attending: Emergency Medicine | Admitting: Emergency Medicine

## 2021-03-31 DIAGNOSIS — M79606 Pain in leg, unspecified: Secondary | ICD-10-CM | POA: Insufficient documentation

## 2021-03-31 DIAGNOSIS — M62830 Muscle spasm of back: Secondary | ICD-10-CM | POA: Diagnosis not present

## 2021-03-31 DIAGNOSIS — M545 Low back pain, unspecified: Secondary | ICD-10-CM | POA: Diagnosis present

## 2021-03-31 DIAGNOSIS — M62838 Other muscle spasm: Secondary | ICD-10-CM

## 2021-03-31 DIAGNOSIS — G8929 Other chronic pain: Secondary | ICD-10-CM | POA: Insufficient documentation

## 2021-03-31 MED ORDER — LIDOCAINE 5 % EX PTCH: 1.0000 | MEDICATED_PATCH | CUTANEOUS | 0 refills | Status: AC

## 2021-03-31 MED ORDER — CYCLOBENZAPRINE HCL 10 MG PO TABS: 10.0000 mg | ORAL_TABLET | Freq: Two times a day (BID) | ORAL | 0 refills | Status: AC | PRN

## 2021-03-31 NOTE — ED Triage Notes (Addendum)
Pt reported to ED with pain that orginates in lower back around hips that radiates downwards into left leg x1 month. Pt denies any vaginal pain, discharge, itching or discomfort and denies urinary frequency, urgency, pain or burning. States she felt like pain was related to arthritis.  ?

## 2021-03-31 NOTE — ED Provider Triage Note (Signed)
Emergency Medicine Provider Triage Evaluation Note ? ?Carol Prince , a 51 y.o. female  was evaluated in triage.  Pt complains of lower back pain and bilateral leg pain.  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  She says she has had some chronic lower back pain for a while now.  She says over the past month though she has noticed pains going down the lateral side of both of her legs to about mid thigh.  She says this is worse when sitting up or walking around.  She denies any saddle anesthesia, gait abnormalities, fevers, chills, bowel or bladder dysfunction, history of IV drug use, history of diabetes. ? ?Review of Systems  ?Positive: Back pain ?Negative:  ? ?Physical Exam  ?BP 126/84 (BP Location: Right Arm)   Pulse (!) 111   Temp 98.6 ?F (37 ?C) (Oral)   Resp 16   SpO2 94%  ?Gen:   Awake, no distress   ?Resp:  Normal effort  ?MSK:   Moves extremities without difficulty  ?Other:  No L spine midline ttp. Reproducible bilateral lower back pain.  ? ?Medical Decision Making  ?Medically screening exam initiated at 7:45 PM.  Appropriate orders placed.  Keishana Klinger was informed that the remainder of the evaluation will be completed by another provider, this initial triage assessment does not replace that evaluation, and the importance of remaining in the ED until their evaluation is complete. ? ? ?  ?Claudie Leach, PA-C ?03/31/21 1947 ? ?

## 2021-03-31 NOTE — ED Provider Notes (Signed)
?[facility] EMERGENCY DEPARTMENT ?Provider Note ? ? ?CSN: [id number] ?Arrival date & time: 03/31/21  1928 ? ?  ? ?History ? ?Chief Complaint  ?Patient presents with  ? Leg Pain  ? ? ?Carol Prince is a 51 y.o. female. ? ?The history is provided by the patient and medical records. No language interpreter was used.  ?Leg Pain ?Associated symptoms: back pain   ?Associated symptoms: no fatigue, no fever and no neck pain   ?Back Pain ?Location:  Lumbar spine and sacro-iliac joint ?Quality:  Cramping and aching ?Radiates to:  L thigh and R thigh ?Pain severity:  Severe ?Pain is:  Unable to specify ?Onset quality:  Gradual ?Duration:  1 month ?Timing:  Constant ?Progression:  Waxing and waning ?Chronicity:  Chronic ?Context: not falling and not recent injury   ?Ineffective treatments:  None tried ?Associated symptoms: leg pain   ?Associated symptoms: no abdominal pain, no abdominal swelling, no bladder incontinence, no bowel incontinence, no chest pain, no dysuria, no fever, no headaches, no numbness, no paresthesias, no pelvic pain, no perianal numbness and no weakness   ? ?  ? ?Home Medications ?Prior to Admission medications   ?Medication Sig Start Date End Date Taking? Authorizing Provider  ?carbamazepine (TEGRETOL) 200 MG tablet Take 200 mg by mouth 2 (two) times daily between meals.     [provider]  ?omeprazole (PRILOSEC) 20 MG capsule Take 1 capsule (20 mg total) by mouth daily. 10/09/16   Mardella Layman, MD  ?   ? ?Allergies    ?Ampicillin   ? ?Review of Systems   ?Review of Systems  ?Constitutional:  Negative for chills, fatigue and fever.  ?HENT:  Negative for congestion.   ?Respiratory:  Negative for cough, chest tightness, shortness of breath and wheezing.   ?Cardiovascular:  Negative for chest pain.  ?Gastrointestinal:  Negative for abdominal pain, bowel incontinence, constipation, diarrhea, nausea and vomiting.  ?Genitourinary:  Negative for bladder incontinence, dysuria, flank  pain, frequency and pelvic pain.  ?Musculoskeletal:  Positive for back pain. Negative for neck pain and neck stiffness.  ?Skin:  Negative for rash and wound.  ?Neurological:  Negative for weakness, numbness, headaches and paresthesias.  ?Psychiatric/Behavioral:  Negative for agitation.   ?All other systems reviewed and are negative. ? ?Physical Exam ?Updated Vital Signs ?BP 126/84 (BP Location: Right Arm)   Pulse (!) 111   Temp 98.6 ?F (37 ?C) (Oral)   Resp 16   SpO2 94%  ?Physical Exam ?Vitals and nursing note reviewed.  ?Constitutional:   ?   General: She is not in acute distress. ?   Appearance: She is well-developed. She is not ill-appearing, toxic-appearing or diaphoretic.  ?HENT:  ?   Head: Normocephalic and atraumatic.  ?Eyes:  ?   Conjunctiva/sclera: Conjunctivae normal.  ?Cardiovascular:  ?   Rate and Rhythm: Normal rate and regular rhythm.  ?   Heart sounds: No murmur heard. ?Pulmonary:  ?   Effort: Pulmonary effort is normal. No respiratory distress.  ?   Breath sounds: Normal breath sounds. No wheezing, rhonchi or rales.  ?Chest:  ?   Chest wall: No tenderness.  ?Abdominal:  ?   General: Abdomen is flat.  ?   Palpations: Abdomen is soft.  ?   Tenderness: There is no abdominal tenderness. There is no right CVA tenderness, left CVA tenderness, guarding or rebound.  ?Musculoskeletal:     ?   General: No swelling or signs of injury.  ?   Cervical  back: Neck supple. No tenderness.  ?   Thoracic back: No tenderness.  ?   Lumbar back: Spasms and tenderness present. Negative right straight leg raise test and negative left straight leg raise test.  ?     Back: ? ?   Comments: Intact sensation, strength, and pulses in lower extremities.  Negative straight leg raise.  Tenderness across the low back but otherwise no focal tenderness  ?Skin: ?   General: Skin is warm and dry.  ?   Capillary Refill: Capillary refill takes less than 2 seconds.  ?   Findings: No erythema.  ?Neurological:  ?   General: No focal  deficit present.  ?   Mental Status: She is alert.  ?   Sensory: No sensory deficit.  ?   Motor: No weakness.  ?Psychiatric:     ?   Mood and Affect: Mood normal.  ? ? ?ED Results / Procedures / Treatments   ?Labs ?(all labs ordered are listed, but only abnormal results are displayed) ?Labs Reviewed - No data to display ? ?EKG ?None ? ?Radiology ?No results found. ? ?Procedures ?Procedures  ? ? ?Medications Ordered in ED ?Medications - No data to display ? ?ED Course/ Medical Decision Making/ A&P ?  ?                        ?Medical Decision Making ? ? ?Carol Prince is a 51 y.o. female with a past medical history significant for GERD, previous hysterectomy, seizures, and previous degenerative disease in her spine presents with back pain.  According to patient, for the last few months she has been having continued back pain that comes and goes and seems to be worse with lifting and physical activity using her torso.  She says that it is a soreness and sharp pain that goes from her low back anteriorly down her thighs bilaterally.  She reports that it feels like spasms and cramps but denies any pain anteriorly.  No chest pain or abdominal pain.  No numbness or weakness.  No loss of bowel or bladder control.  No report of IV drug use or previous spine surgeries.  No urinary symptoms.  Reports the pain is moderate to severe when she is mopping or sweeping or doing those maneuvers.  She said that she has not seen a back doctor but has known degenerative changes.  She has taken some anti-inflammatory medications occasionally with some relief. ? ?On exam, lungs clear.  Chest nontender.  Abdomen nontender.  Patient has intact sensation and strength in lower extremities and was negative for straight leg raise bilaterally.  She reports the soreness is more on her anterior thighs wrapping around towards her back bilaterally.  She did have tenderness across her low back but did not have CVA tenderness or any upper or mid back  tenderness.  Good pulses in lower extremities.  Exam otherwise unremarkable.  No rashes seen. ? ?Clinically I do suspect she is having spasms bilaterally in her low back that is causing pain going on the legs.  It does not sound like radicular type pain as it is not on the back of her legs and straight leg was negative.  Exam was otherwise unremarkable and she had no other red flags for a cord injury.  She denied any acute trauma with his onset it was just gradual. ? ?We discussed getting advanced imaging or even x-rays however given her lack of trauma and the chronic  nature of her symptoms, she agreed that she would prefer getting some medications to try to help and then follow-up with a back doctor. ? ?Clinically I was suspicion for urinary cause flank pain.  ? ?We agreed to give prescription for Flexeril and Lidoderm patches and she will continue anti-inflammatory medications at home.  She will follow-up with a back doctor and if any symptoms were to change or worsen, patient understands return precautions and follow-up instructions. ? ? ? ? ? ? ? ? ? ? ?Final Clinical Impression(s) / ED Diagnoses ?Final diagnoses:  ?Muscle spasm  ?Chronic bilateral low back pain without sciatica  ? ? ?Rx / DC Orders ?ED Discharge Orders   ? ?      Ordered  ?  cyclobenzaprine (FLEXERIL) 10 MG tablet  2 times daily PRN       ? 03/31/21 2143  ?  lidocaine (LIDODERM) 5 %  Every 24 hours       ? 03/31/21 2143  ? ?  ?  ? ?  ? ? ?Clinical Impression: ?1. Muscle spasm   ?2. Chronic bilateral low back pain without sciatica   ? ? ?Disposition: Discharge ? ?Condition: Good ? ?I have discussed the results, Dx and Tx plan with the pt(& family if present). He/she/they expressed understanding and agree(s) with the plan. Discharge instructions discussed at great length. Strict return precautions discussed and pt &/or family have verbalized understanding of the instructions. No further questions at time of discharge.  ? ? ?New Prescriptions  ?  CYCLOBENZAPRINE (FLEXERIL) 10 MG TABLET    Take 1 tablet (10 mg total) by mouth 2 (two) times daily as needed for muscle spasms.  ? LIDOCAINE (LIDODERM) 5 %    Place 1 patch onto the skin daily. Remove & Discard

## 2021-03-31 NOTE — Discharge Instructions (Signed)
Your history, exam, and work-up today are consistent with likely acute on chronic worsening of your back pain.  Given your symptoms being provoked by different maneuvers and physical movements I do suspect it is primarily musculoskeletal.  Your exam was consistent with muscle spasm and there was no evidence of acute cord injury or nerve symptoms today.  After shared decision-making conversation, we agree it is reasonable to try muscle relaxant, Lidoderm patches, and spine follow-up.  If any symptoms change or worsen acutely, please return to the nearest emergency department ?

## 2023-10-13 ENCOUNTER — Other Ambulatory Visit: Payer: Self-pay

## 2023-10-13 ENCOUNTER — Ambulatory Visit
Admission: EM | Admit: 2023-10-13 | Discharge: 2023-10-13 | Disposition: A | Discharge status: Home / Self Care | Attending: Family Medicine | Admitting: Family Medicine

## 2023-10-13 DIAGNOSIS — J069 Acute upper respiratory infection, unspecified: Secondary | ICD-10-CM | POA: Diagnosis not present

## 2023-10-13 DIAGNOSIS — J029 Acute pharyngitis, unspecified: Secondary | ICD-10-CM | POA: Diagnosis not present

## 2023-10-13 DIAGNOSIS — R051 Acute cough: Secondary | ICD-10-CM

## 2023-10-13 LAB — POCT RAPID STREP A (OFFICE): Rapid Strep A Screen: NEGATIVE

## 2023-10-13 LAB — POC SOFIA SARS ANTIGEN FIA: SARS Coronavirus 2 Ag: NEGATIVE

## 2023-10-13 MED ORDER — PROMETHAZINE-DM 6.25-15 MG/5ML PO SYRP: 5.0000 mL | ORAL_SOLUTION | Freq: Four times a day (QID) | ORAL | 0 refills | Status: AC | PRN

## 2023-10-13 NOTE — Discharge Instructions (Signed)
 You have tested negative for COVID and strep throat.  Please treat your symptoms with over the counter tylenol  or ibuprofen , humidifier, and rest.  You may take Promethazine  DM as needed for your cough.  Please note this medication will make you drowsy.  Do not drink alcohol or drive while on this medication.  Viral illnesses can last 7-14 days. Please follow up with your PCP if your symptoms are not improving. Please go to the ER for any worsening symptoms. This includes but is not limited to fever you can not control with tylenol  or ibuprofen , you are not able to stay hydrated, you have shortness of breath or chest pain.  Thank you for choosing [facility] for your healthcare needs. I hope you feel better soon!

## 2023-10-13 NOTE — ED Triage Notes (Signed)
 Pt c/o sore throat, midsternal chest pain, productive cough w/yellow mucousx3d.

## 2023-10-13 NOTE — ED Provider Notes (Signed)
 [facility]    CSN: [id number] Arrival date & time: 10/13/23  1037      History   Chief Complaint No chief complaint on file.   HPI Carol Prince is a 53 y.o. female  presents for evaluation of URI symptoms for 3 days. Patient reports associated symptoms of cough, congestion, sore throat, chest pain with coughing. Denies N/V/D, fevers, ear pain, body aches, shortness of breath. Patient does not have a hx of asthma. Patient not not an active smoker.  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  Pt has taken nothing OTC for symptoms. Pt has no other concerns at this time.   HPI  Past Medical History:  Diagnosis Date   GERD (gastroesophageal reflux disease)    Seizures (HCC)    last seizure- 2015     Patient Active Problem List   Diagnosis Date Noted   Rotator cuff tear 08/15/2014   Anxiety state 07/05/2006   Allergic rhinitis 07/05/2006   SEIZURE DISORDER 07/05/2006    Past Surgical History:  Procedure Laterality Date   ABDOMINAL HYSTERECTOMY     HYDRADENITIS EXCISION Left 11/03/2015   Procedure: EXCISION HIDRADENITIS  LEFT AXILLA;  Surgeon: Elna Pick, MD;  Location: [facility];  Service: Plastics;  Laterality: Left;   SHOULDER ARTHROSCOPY WITH SUBACROMIAL DECOMPRESSION AND OPEN ROTATOR C Left 08/15/2014   Procedure: LEFT SHOULDER ARTHROSCOPY WITH SUBACROMIAL DECOMPRESSION AND MINI OPEN ROTATOR CUFF REPAIR;  Surgeon: Reyes Billing, MD;  Location: [facility];  Service: Orthopedics;  Laterality: Left;  With ANCHORS    OB History   No obstetric history on file.      Home Medications    Prior to Admission medications   Medication Sig Start Date End Date Taking? Authorizing Provider  promethazine -dextromethorphan (PROMETHAZINE -DM) 6.25-15 MG/5ML syrup Take 5 mLs by mouth 4 (four) times daily as needed for cough. 10/13/23  Yes Laquan Ludden, Jodi R, NP  carbamazepine  (TEGRETOL ) 200 MG tablet Take 200 mg by mouth 2 (two) times daily  between meals.     [provider]  cyclobenzaprine  (FLEXERIL ) 10 MG tablet Take 1 tablet (10 mg total) by mouth 2 (two) times daily as needed for muscle spasms. 03/31/21   Tegeler, Lonni PARAS, MD  lidocaine  (LIDODERM ) 5 % Place 1 patch onto the skin daily. Remove & Discard patch within 12 hours or as directed by MD 03/31/21   Tegeler, Lonni PARAS, MD  omeprazole  (PRILOSEC) 20 MG capsule Take 1 capsule (20 mg total) by mouth daily. 10/09/16   Rolinda Rogue, MD    Family History Family History  Problem Relation Age of Onset   Breast cancer Maternal Aunt     Social History Social History   Tobacco Use   Smoking status: Never   Smokeless tobacco: Never  Substance Use Topics   Alcohol use: No   Drug use: No     Allergies   Ampicillin   Review of Systems Review of Systems  HENT:  Positive for congestion and sore throat.   Respiratory:  Positive for cough.      Physical Exam Triage Vital Signs ED Triage Vitals  Encounter Vitals Group     BP 10/13/23 1048 116/83     Girls Systolic BP Percentile --      Girls Diastolic BP Percentile --      Boys Systolic BP Percentile --      Boys Diastolic BP Percentile --      Pulse Rate 10/13/23 1048 73  Resp 10/13/23 1048 17     Temp 10/13/23 1048 98.6 F (37 C)     Temp Source 10/13/23 1048 Oral     SpO2 10/13/23 1048 96 %     Weight --      Height --      Head Circumference --      Peak Flow --      Pain Score 10/13/23 1045 5     Pain Loc --      Pain Education --      Exclude from Growth Chart --    No data found.  Updated Vital Signs BP 116/83   Pulse 73   Temp 98.6 F (37 C) (Oral)   Resp 17   SpO2 96%   Visual Acuity Right Eye Distance:   Left Eye Distance:   Bilateral Distance:    Right Eye Near:   Left Eye Near:    Bilateral Near:     Physical Exam Vitals and nursing note reviewed.  Constitutional:      General: She is not in acute distress.    Appearance: She is well-developed. She  is not ill-appearing.  HENT:     Head: Normocephalic and atraumatic.     Right Ear: Tympanic membrane and ear canal normal.     Left Ear: Tympanic membrane and ear canal normal.     Nose: Congestion present.     Mouth/Throat:     Mouth: Mucous membranes are moist.     Pharynx: Oropharynx is clear. Uvula midline. Posterior oropharyngeal erythema present.     Tonsils: No tonsillar exudate or tonsillar abscesses.  Eyes:     Conjunctiva/sclera: Conjunctivae normal.     Pupils: Pupils are equal, round, and reactive to light.  Cardiovascular:     Rate and Rhythm: Normal rate and regular rhythm.     Heart sounds: Normal heart sounds.  Pulmonary:     Effort: Pulmonary effort is normal. No respiratory distress.     Breath sounds: Normal breath sounds. No stridor. No wheezing, rhonchi or rales.  Musculoskeletal:     Cervical back: Normal range of motion and neck supple.  Lymphadenopathy:     Cervical: No cervical adenopathy.  Skin:    General: Skin is warm and dry.  Neurological:     General: No focal deficit present.     Mental Status: She is alert and oriented to person, place, and time.  Psychiatric:        Mood and Affect: Mood normal.        Behavior: Behavior normal.      UC Treatments / Results  Labs (all labs ordered are listed, but only abnormal results are displayed) Labs Reviewed  POC SOFIA SARS ANTIGEN FIA  POCT RAPID STREP A (OFFICE)    EKG   Radiology No results found.  Procedures Procedures (including critical care time)  Medications Ordered in UC Medications - No data to display  Initial Impression / Assessment and Plan / UC Course  I have reviewed the triage vital signs and the nursing notes.  Pertinent labs & imaging results that were available during my care of the patient were reviewed by me and considered in my medical decision making (see chart for details).     Reviewed exam and symptoms with patient.  No red flags.  Negative rapid strep and  COVID testing.  Discussed viral illness and symptomatic treatment.  Promethazine  DM as needed for cough, side effect profile reviewed.  Encouraged rest fluids  and PCP follow-up if symptoms do not improve.  ER precautions reviewed. Final Clinical Impressions(s) / UC Diagnoses   Final diagnoses:  Acute cough  Sore throat  Viral upper respiratory illness     Discharge Instructions      You have tested negative for COVID and strep throat.  Please treat your symptoms with over the counter tylenol  or ibuprofen , humidifier, and rest.  You may take Promethazine  DM as needed for your cough.  Please note this medication will make you drowsy.  Do not drink alcohol or drive while on this medication.  Viral illnesses can last 7-14 days. Please follow up with your PCP if your symptoms are not improving. Please go to the ER for any worsening symptoms. This includes but is not limited to fever you can not control with tylenol  or ibuprofen , you are not able to stay hydrated, you have shortness of breath or chest pain.  Thank you for choosing [facility] for your healthcare needs. I hope you feel better soon!      ED Prescriptions     Medication Sig Dispense Auth. Provider   promethazine -dextromethorphan (PROMETHAZINE -DM) 6.25-15 MG/5ML syrup Take 5 mLs by mouth 4 (four) times daily as needed for cough. 118 mL Ervin Rothbauer, Jodi R, NP      PDMP not reviewed this encounter.   Loreda Myla SAUNDERS, NP 10/13/23 [phone]

## 2023-11-25 ENCOUNTER — Other Ambulatory Visit: Payer: Self-pay

## 2023-11-25 ENCOUNTER — Emergency Department (HOSPITAL_COMMUNITY)
Admission: EM | Admit: 2023-11-25 | Discharge: 2023-11-25 | Disposition: A | Discharge status: Home / Self Care | Payer: Self-pay | Attending: Emergency Medicine | Admitting: Emergency Medicine

## 2023-11-25 ENCOUNTER — Encounter (HOSPITAL_COMMUNITY): Payer: Self-pay | Admitting: *Deleted

## 2023-11-25 ENCOUNTER — Emergency Department (HOSPITAL_COMMUNITY): Payer: Self-pay

## 2023-11-25 DIAGNOSIS — M25562 Pain in left knee: Secondary | ICD-10-CM | POA: Insufficient documentation

## 2023-11-25 MED ORDER — MELOXICAM 15 MG PO TABS: 15.0000 mg | ORAL_TABLET | Freq: Every day | ORAL | 0 refills | Status: AC

## 2023-11-25 MED ORDER — HYDROCODONE-ACETAMINOPHEN 5-325 MG PO TABS: 1.0000 | ORAL_TABLET | Freq: Four times a day (QID) | ORAL | 0 refills | Status: AC | PRN

## 2023-11-25 NOTE — ED Provider Notes (Signed)
 [place] EMERGENCY DEPARTMENT AT [facility] Provider Note   CSN: [id number] Arrival date & time: 11/25/23  1550     Patient presents with: Knee Pain   Carol Prince is a 53 y.o. female.   Patient to ED c/o left knee pain. This is a recurrent problem. No new injury or trauma. No fever. No calf or this pain/swelling.   The history is provided by the patient. No language interpreter was used.  Knee Pain      Prior to Admission medications   Medication Sig Start Date End Date Taking? Authorizing Provider  HYDROcodone -acetaminophen  (NORCO/VICODIN) 5-325 MG tablet Take 1 tablet by mouth every 6 (six) hours as needed for severe pain (pain score 7-10). 11/25/23  Yes Kimmora Risenhoover, Margit, PA-C  meloxicam  (MOBIC ) 15 MG tablet Take 1 tablet (15 mg total) by mouth daily. 11/25/23  Yes Duff Pozzi, Margit, PA-C  carbamazepine  (TEGRETOL ) 200 MG tablet Take 200 mg by mouth 2 (two) times daily between meals.     [provider]  cyclobenzaprine  (FLEXERIL ) 10 MG tablet Take 1 tablet (10 mg total) by mouth 2 (two) times daily as needed for muscle spasms. 03/31/21   Tegeler, Lonni PARAS, MD  lidocaine  (LIDODERM ) 5 % Place 1 patch onto the skin daily. Remove & Discard patch within 12 hours or as directed by MD 03/31/21   Tegeler, Lonni PARAS, MD  omeprazole  (PRILOSEC) 20 MG capsule Take 1 capsule (20 mg total) by mouth daily. 10/09/16   Rolinda Rogue, MD  promethazine -dextromethorphan (PROMETHAZINE -DM) 6.25-15 MG/5ML syrup Take 5 mLs by mouth 4 (four) times daily as needed for cough. 10/13/23   Mayer, Jodi R, NP    Allergies: Ampicillin    Review of Systems  Updated Vital Signs BP 123/80 (BP Location: Right Arm)   Pulse 78   Temp 98.1 F (36.7 C) (Oral)   Resp 20   Ht 5' 4 (1.626 m)   Wt 99.3 kg   SpO2 100%   BMI 37.58 kg/m   Physical Exam Vitals and nursing note reviewed.  Constitutional:      General: She is not in acute distress.    Appearance: She is well-developed.  She is not ill-appearing.  Pulmonary:     Effort: Pulmonary effort is normal.  Musculoskeletal:        General: Normal range of motion.     Cervical back: Normal range of motion.     Comments: Left knee is moderately swollen. Tender across anterior knee and inferior pattellar border. No significant effusion. No redness or warmth. There is a full ROM. No calf or thigh tenderness.   Skin:    General: Skin is warm and dry.  Neurological:     Mental Status: She is alert and oriented to person, place, and time.     (all labs ordered are listed, but only abnormal results are displayed) Labs Reviewed - No data to display  EKG: None  Radiology: DG Knee Complete 4 Views Left Result Date: 11/25/2023 CLINICAL DATA:  Chronic left knee pain for several months, worsening recently EXAM: LEFT KNEE - COMPLETE 4+ VIEW COMPARISON:  None Available. FINDINGS: Frontal, bilateral oblique, and lateral views of the left knee are obtained. No acute fracture, subluxation, or dislocation. Mild to moderate 3 compartmental osteoarthritis greatest in the lateral compartment. No joint effusion. Soft tissues are unremarkable. IMPRESSION: 1. 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 2. No acute fracture. Electronically Signed   By: Ozell Daring M.D.   On: 11/25/2023  17:03     Procedures   Medications Ordered in the ED - No data to display  Clinical Course as of 11/25/23 2107  Fri Nov 25, 2023  2105 Patient with recurrent left knee pain. Known arthritis, evaluated by sports medicine in the past. No new injury. No evidence to suggest septic joint. DDx mainly arthritis vs patellar bursitis. Rx Mobic . Norco for severe pain. Encouraged re-engaging with Dr. Germaine, Sports Medicine.  [SU]    Clinical Course User Index [SU] Odell Balls, PA-C                                 Medical Decision Making Risk Prescription drug management.        Final diagnoses:  Acute pain of  left knee    ED Discharge Orders          Ordered    meloxicam  (MOBIC ) 15 MG tablet  Daily        11/25/23 2033    HYDROcodone -acetaminophen  (NORCO/VICODIN) 5-325 MG tablet  Every 6 hours PRN        11/25/23 2033               Odell Balls, PA-C 11/25/23 2107    Randol Simmonds, MD 11/27/23 1306

## 2023-11-25 NOTE — ED Provider Triage Note (Signed)
 Emergency Medicine Provider Triage Evaluation Note  Gessica Jawad , a 53 y.o. female  was evaluated in triage.  Pt complains of left knee pain on and off for many years.  Review of Systems  Positive: Left knee pain, swelling Negative: Fever, chills, trauma  Physical Exam  BP 134/86   Pulse 97   Temp 98.1 F (36.7 C)   Resp 19   Ht 5' 4 (1.626 m)   Wt 99.3 kg   SpO2 97%   BMI 37.58 kg/m  Gen:   Awake, no distress   Resp:  Normal effort  MSK:   Moves extremities without difficulty  Other:    Medical Decision Making  Medically screening exam initiated at 4:09 PM.  Appropriate orders placed.  Taaliyah Delpriore was informed that the remainder of the evaluation will be completed by another provider, this initial triage assessment does not replace that evaluation, and the importance of remaining in the ED until their evaluation is complete.  Imaging ordered   Francis Ileana SAILOR, PA-C 11/25/23 [id number]

## 2023-11-25 NOTE — ED Triage Notes (Signed)
 Pt c/o chronic L knee pain ongoing for several months, worsening in recent days. Pt denies known injury.

## 2023-11-25 NOTE — Progress Notes (Signed)
 Orthopedic Tech Progress Note Patient Details:  Carol Prince November 01, 1970 [id number]  Ortho Devices Type of Ortho Device: Knee Immobilizer Ortho Device/Splint Location: LLE Ortho Device/Splint Interventions: Ordered, Application, Adjustment   Post Interventions Patient Tolerated: Well, Ambulated well Instructions Provided: Poper ambulation with device, Care of device  [name] Pac 11/25/2023, 8:48 PM

## 2023-11-25 NOTE — Discharge Instructions (Signed)
 As we discussed, the knee pain is likely due to known arthritis in the knee. Take Mobic  daily for pain, and Norco every 6 hours as needed for severe pain. 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   Follow up with Dr. Germaine who you have seen in the past for continued care. Wear the knee immobilizer when active and use your cane to help with walking. REturn to the ED with any new or concerning symptoms at any time.
